# Patient Record
Sex: Female | Born: 1945 | Race: White | Hispanic: No | Smoking: Never smoker
Health system: Southern US, Community
[De-identification: ages and names within clinical notes are randomized; demographics above are authoritative.]

## PROBLEM LIST (undated history)

## (undated) DIAGNOSIS — Z789 Other specified health status: Secondary | ICD-10-CM

## (undated) DIAGNOSIS — T7840XA Allergy, unspecified, initial encounter: Secondary | ICD-10-CM

## (undated) DIAGNOSIS — E785 Hyperlipidemia, unspecified: Secondary | ICD-10-CM

## (undated) DIAGNOSIS — B182 Chronic viral hepatitis C: Secondary | ICD-10-CM

## (undated) DIAGNOSIS — I77810 Thoracic aortic ectasia: Secondary | ICD-10-CM

## (undated) DIAGNOSIS — I2510 Atherosclerotic heart disease of native coronary artery without angina pectoris: Secondary | ICD-10-CM

## (undated) DIAGNOSIS — R0600 Dyspnea, unspecified: Secondary | ICD-10-CM

## (undated) DIAGNOSIS — I10 Essential (primary) hypertension: Secondary | ICD-10-CM

## (undated) DIAGNOSIS — R0609 Other forms of dyspnea: Secondary | ICD-10-CM

## (undated) DIAGNOSIS — F32A Depression, unspecified: Secondary | ICD-10-CM

## (undated) HISTORY — DX: Allergy, unspecified, initial encounter: T78.40XA

## (undated) HISTORY — DX: Atherosclerotic heart disease of native coronary artery without angina pectoris: I25.10

## (undated) HISTORY — DX: Depression, unspecified: F32.A

## (undated) HISTORY — PX: ABDOMINAL HYSTERECTOMY: SHX81

## (undated) HISTORY — PX: BACK SURGERY: SHX140

## (undated) HISTORY — DX: Hyperlipidemia, unspecified: E78.5

## (undated) HISTORY — DX: Unspecified cirrhosis of liver: B18.2

## (undated) HISTORY — DX: Other forms of dyspnea: R06.09

## (undated) HISTORY — DX: Thoracic aortic ectasia: I77.810

## (undated) HISTORY — PX: CHOLECYSTECTOMY: SHX55

## (undated) HISTORY — DX: Essential (primary) hypertension: I10

## (undated) HISTORY — DX: Dyspnea, unspecified: R06.00

## (undated) HISTORY — DX: Other specified health status: Z78.9

---

## 2008-06-23 HISTORY — PX: LUMBAR DISC SURGERY: SHX700

## 2014-01-18 DIAGNOSIS — I10 Essential (primary) hypertension: Secondary | ICD-10-CM | POA: Insufficient documentation

## 2015-01-18 DIAGNOSIS — E782 Mixed hyperlipidemia: Secondary | ICD-10-CM | POA: Insufficient documentation

## 2015-01-18 DIAGNOSIS — K7460 Unspecified cirrhosis of liver: Secondary | ICD-10-CM | POA: Insufficient documentation

## 2015-01-18 DIAGNOSIS — B182 Chronic viral hepatitis C: Secondary | ICD-10-CM | POA: Insufficient documentation

## 2015-06-19 DIAGNOSIS — I77810 Thoracic aortic ectasia: Secondary | ICD-10-CM | POA: Insufficient documentation

## 2016-01-07 LAB — HM COLONOSCOPY

## 2016-07-29 DIAGNOSIS — F331 Major depressive disorder, recurrent, moderate: Secondary | ICD-10-CM | POA: Insufficient documentation

## 2017-04-06 DIAGNOSIS — I2584 Coronary atherosclerosis due to calcified coronary lesion: Secondary | ICD-10-CM | POA: Insufficient documentation

## 2017-04-06 DIAGNOSIS — I2510 Atherosclerotic heart disease of native coronary artery without angina pectoris: Secondary | ICD-10-CM | POA: Insufficient documentation

## 2018-03-30 LAB — HM DEXA SCAN

## 2018-04-01 LAB — HM MAMMOGRAPHY

## 2019-02-02 LAB — VITAMIN B12: Vitamin B-12: 501

## 2019-11-22 DIAGNOSIS — T466X5A Adverse effect of antihyperlipidemic and antiarteriosclerotic drugs, initial encounter: Secondary | ICD-10-CM | POA: Insufficient documentation

## 2020-09-05 LAB — CBC AND DIFFERENTIAL
HCT: 46 (ref 36–46)
Hemoglobin: 14.7 (ref 12.0–16.0)
Platelets: 223 (ref 150–399)
WBC: 7.7

## 2020-09-05 LAB — BASIC METABOLIC PANEL
BUN: 23 — AB (ref 4–21)
CO2: 26 — AB (ref 13–22)
Chloride: 103 (ref 99–108)
Creatinine: 0.8 (ref 0.5–1.1)
Glucose: 88
Potassium: 4.2 (ref 3.4–5.3)
Sodium: 140 (ref 137–147)

## 2020-09-05 LAB — LIPID PANEL
Cholesterol: 157 (ref 0–200)
HDL: 71 — AB (ref 35–70)
LDL Cholesterol: 50
Triglycerides: 180 — AB (ref 40–160)

## 2020-09-05 LAB — HEPATIC FUNCTION PANEL
ALT: 21 (ref 7–35)
AST: 21 (ref 13–35)
Alkaline Phosphatase: 67 (ref 25–125)
Bilirubin, Total: 0.5

## 2020-09-05 LAB — COMPREHENSIVE METABOLIC PANEL: Calcium: 10.1 (ref 8.7–10.7)

## 2020-09-05 LAB — TSH: TSH: 2.42 (ref 0.41–5.90)

## 2020-12-07 ENCOUNTER — Ambulatory Visit: Payer: Self-pay | Admitting: Internal Medicine

## 2020-12-10 ENCOUNTER — Other Ambulatory Visit: Payer: Self-pay | Admitting: Internal Medicine

## 2020-12-10 ENCOUNTER — Encounter: Payer: Self-pay | Admitting: Internal Medicine

## 2020-12-10 DIAGNOSIS — I700 Atherosclerosis of aorta: Secondary | ICD-10-CM | POA: Insufficient documentation

## 2020-12-11 ENCOUNTER — Ambulatory Visit: Payer: Medicare PPO | Admitting: Internal Medicine

## 2020-12-11 ENCOUNTER — Encounter: Payer: Self-pay | Admitting: Internal Medicine

## 2020-12-11 ENCOUNTER — Other Ambulatory Visit: Payer: Self-pay

## 2020-12-11 VITALS — BP 118/64 | HR 90 | Ht 66.0 in | Wt 254.0 lb

## 2020-12-11 DIAGNOSIS — F331 Major depressive disorder, recurrent, moderate: Secondary | ICD-10-CM

## 2020-12-11 DIAGNOSIS — I10 Essential (primary) hypertension: Secondary | ICD-10-CM | POA: Diagnosis not present

## 2020-12-11 DIAGNOSIS — I77810 Thoracic aortic ectasia: Secondary | ICD-10-CM

## 2020-12-11 DIAGNOSIS — M810 Age-related osteoporosis without current pathological fracture: Secondary | ICD-10-CM

## 2020-12-11 DIAGNOSIS — E039 Hypothyroidism, unspecified: Secondary | ICD-10-CM

## 2020-12-11 DIAGNOSIS — K7469 Other cirrhosis of liver: Secondary | ICD-10-CM | POA: Diagnosis not present

## 2020-12-11 DIAGNOSIS — K219 Gastro-esophageal reflux disease without esophagitis: Secondary | ICD-10-CM

## 2020-12-11 DIAGNOSIS — Z6841 Body Mass Index (BMI) 40.0 and over, adult: Secondary | ICD-10-CM

## 2020-12-11 DIAGNOSIS — E559 Vitamin D deficiency, unspecified: Secondary | ICD-10-CM

## 2020-12-11 MED ORDER — OMEPRAZOLE 40 MG PO CPDR: 40.0000 mg | DELAYED_RELEASE_CAPSULE | Freq: Every day | ORAL | 1 refills | Status: DC

## 2020-12-11 NOTE — Progress Notes (Signed)
Date:  12/11/2020   Name:  Rachel Dalton   DOB:  [birth year]   MRN:  [id number]   Chief Complaint: Establish Care (Patient came in today with her daughter in law Desiree who she calls her "life line.")  Hypertension This is a chronic problem. The problem is controlled. Pertinent negatives include no chest pain, headaches, palpitations or shortness of breath. Past treatments include angiotensin blockers and calcium channel blockers. Identifiable causes of hypertension include a thyroid problem.  Depression        This is a chronic problem.The problem is unchanged.  Associated symptoms include no fatigue, no headaches and no suicidal ideas.  Past treatments include SSRIs - Selective serotonin reuptake inhibitors and other medications.  Compliance with treatment is good.  Past medical history includes thyroid problem.   Hyperlipidemia This is a chronic problem. The problem is uncontrolled. Pertinent negatives include no chest pain or shortness of breath. Current antihyperlipidemic treatment includes ezetimibe (and repatha). The current treatment provides significant improvement of lipids.  Thyroid Problem Presents for follow-up visit. Symptoms include anxiety. Patient reports no fatigue or palpitations. The symptoms have been stable. Her past medical history is significant for hyperlipidemia.  Gastroesophageal Reflux She complains of heartburn. She reports no abdominal pain, no chest pain, no choking, no coughing, no dysphagia, no sore throat or no wheezing. The problem occurs occasionally. Pertinent negatives include no fatigue. She has tried a PPI for the symptoms. The treatment provided significant relief.   Lab Results  Component Value Date   CREATININE 0.8 09/05/2020   BUN 23 (A) 09/05/2020   NA 140 09/05/2020   K 4.2 09/05/2020   CL 103 09/05/2020   CO2 26 (A) 09/05/2020   Lab Results  Component Value Date   CHOL 157 09/05/2020   HDL 71 (A) 09/05/2020   LDLCALC 50 09/05/2020   TRIG  180 (A) 09/05/2020   Lab Results  Component Value Date   TSH 2.42 09/05/2020   No results found for: HGBA1C Lab Results  Component Value Date   WBC 7.7 09/05/2020   HGB 14.7 09/05/2020   HCT 46 09/05/2020   PLT 223 09/05/2020   Lab Results  Component Value Date   ALT 21 09/05/2020   AST 21 09/05/2020   ALKPHOS 67 09/05/2020     Review of Systems  Constitutional:  Positive for unexpected weight change. Negative for chills, fatigue and fever.  HENT:  Negative for sore throat.   Respiratory:  Negative for cough, choking, shortness of breath and wheezing.   Cardiovascular:  Negative for chest pain, palpitations and leg swelling.  Gastrointestinal:  Positive for heartburn. Negative for abdominal pain and dysphagia.  Genitourinary:  Negative for dysuria.  Musculoskeletal:  Negative for arthralgias and gait problem.  Skin:  Negative for color change and rash.  Neurological:  Negative for dizziness, light-headedness and headaches.  Psychiatric/Behavioral:  Positive for depression and dysphoric mood. Negative for sleep disturbance and suicidal ideas. The patient is nervous/anxious.        Mild memory impairment   Patient Active Problem List   Diagnosis Date Noted   Other cirrhosis of liver (HCC) 12/11/2020   Vitamin D deficiency 12/11/2020   GERD without esophagitis 12/11/2020   Acquired hypothyroidism 12/11/2020   Age-related osteoporosis without current pathological fracture 12/11/2020   BMI 40.0-44.9, adult (HCC) 12/11/2020   Aortic atherosclerosis (HCC) 12/10/2020   Myalgia due to statin 11/22/2019   Coronary artery calcification 04/06/2017   Depression, major, recurrent, moderate (HCC) 07/29/2016  Ascending aorta dilatation (HCC) 06/19/2015   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   Essential hypertension 01/18/2014    Allergies  Allergen Reactions   Topiramate Other (See Comments)    Balance and memory issues   Atorvastatin     Other reaction(s): Muscle Pain    Lisinopril Cough   Pravastatin     Other reaction(s): Muscle Pain   Rosuvastatin     Other reaction(s): Muscle Pain   Candesartan Other (See Comments)    dizziness   Tape Rash    Past Surgical History:  Procedure Laterality Date   ABDOMINAL HYSTERECTOMY     BACK SURGERY     CHOLECYSTECTOMY      Social History   Tobacco Use   Smoking status: Never   Smokeless tobacco: Never  Vaping Use   Vaping Use: Never used  Substance Use Topics   Alcohol use: Yes    Comment: occasionally   Drug use: Never     Medication list has been reviewed and updated.  Current Meds  Medication Sig   acetaminophen (TYLENOL) 500 MG tablet Take by mouth.   amLODipine (NORVASC) 5 MG tablet Take by mouth.   ascorbic acid (VITAMIN C) 1000 MG tablet Take by mouth.   aspirin 81 MG EC tablet Take by mouth.   buPROPion (WELLBUTRIN XL) 150 MG 24 hr tablet TAKE 1 TABLET(150 MG) BY MOUTH EVERY DAY   busPIRone (BUSPAR) 7.5 MG tablet TAKE 1 TABLET(7.5 MG) BY MOUTH TWICE DAILY   Cholecalciferol 25 MCG (1000 UT) tablet Take by mouth.   Evolocumab (REPATHA SURECLICK) 140 MG/ML SOAJ INJECT 1 ML UNDER THE SKIN EVERY 14 DAYS   ezetimibe (ZETIA) 10 MG tablet TAKE 1 TABLET(10 MG) BY MOUTH EVERY DAY   fluticasone (FLONASE) 50 MCG/ACT nasal spray Place into both nostrils daily.   levothyroxine (SYNTHROID) 100 MCG tablet Take by mouth.   loratadine (CLARITIN) 10 MG tablet Take 10 mg by mouth daily.   losartan (COZAAR) 25 MG tablet Take 1 tablet by mouth daily.   Multiple Vitamin (MULTI-VITAMIN) tablet Take by mouth.   sertraline (ZOLOFT) 100 MG tablet Take by mouth.   [DISCONTINUED] omeprazole (PRILOSEC) 40 MG capsule Take by mouth.    PHQ 2/9 Scores 12/11/2020  PHQ - 2 Score 6  PHQ- 9 Score 19    GAD 7 : Generalized Anxiety Score 12/11/2020  Nervous, Anxious, on Edge 2  Control/stop worrying 1  Worry too much - different things 1  Trouble relaxing 2  Restless 1  Easily annoyed or irritable 1  Afraid -  awful might happen 0  Total GAD 7 Score 8    BP Readings from Last 3 Encounters:  12/11/20 118/64    Physical Exam Vitals and nursing note reviewed.  Constitutional:      General: She is not in acute distress.    Appearance: She is well-developed.  HENT:     Head: Normocephalic and atraumatic.  Neck:     Vascular: No carotid bruit.  Cardiovascular:     Rate and Rhythm: Normal rate and regular rhythm.     Pulses: Normal pulses.     Heart sounds: No murmur heard. Pulmonary:     Effort: Pulmonary effort is normal. No respiratory distress.     Breath sounds: No wheezing or rhonchi.  Musculoskeletal:     Cervical back: Normal range of motion.     Right lower leg: No edema.     Left lower leg: No edema.  Lymphadenopathy:  Cervical: No cervical adenopathy.  Skin:    General: Skin is warm and dry.     Capillary Refill: Capillary refill takes less than 2 seconds.     Findings: No rash.  Neurological:     General: No focal deficit present.     Mental Status: She is alert and oriented to person, place, and time.  Psychiatric:        Mood and Affect: Mood normal.        Behavior: Behavior normal.    Wt Readings from Last 3 Encounters:  12/11/20 254 lb (115.2 kg)    BP 118/64   Pulse 90   Ht 5\' 6"  (1.676 m)   Wt 254 lb (115.2 kg)   LMP  (LMP Unknown)   SpO2 95%   BMI 41.00 kg/m   Assessment and Plan: 1. Essential hypertension Clinically stable exam with well controlled BP. Tolerating medications without side effects at this time. Pt to continue current regimen and low sodium diet; benefits of regular exercise as able discussed.  2. Ascending aorta dilatation [facility]) Being followed by Cardiology  3. Depression, major, recurrent, moderate (HCC) Clinically stable on current regimen with good control of symptoms, No SI or HI. Will continue current therapy.  4. Other cirrhosis of liver (HCC) S/p treatment for Hep C Recommend follow up with Gastroenterology as  planned  5. Acquired hypothyroidism Supplemented Recent labs normal  6. GERD without esophagitis Symptoms well controlled on daily PPI No red flag signs such as weight loss, n/v, melena Will continue omeprazole. - omeprazole (PRILOSEC) 40 MG capsule; Take 1 capsule (40 mg total) by mouth daily.  Dispense: 90 capsule; Refill: 1  7. Vitamin D deficiency On supplements  8. Age-related osteoporosis without current pathological fracture Hx of DEXA On Vitamin D  9. BMI 40.0-44.9, adult [facility]) Discussed portion control, more regular exercise Explore option for Silver Sneakers   Partially dictated using [facility]. Any errors are unintentional.  Animal nutritionist, MD [facility]  12/11/2020

## 2021-02-05 ENCOUNTER — Telehealth: Payer: Self-pay | Admitting: Internal Medicine

## 2021-02-05 NOTE — Telephone Encounter (Signed)
Copied from CRM [id number]. Topic: Medicare AWV >> Feb 05, 2021 11:02 AM Claudette Laws R wrote: Reason for CRM: Left message for patient to call back and schedule Medicare Annual Wellness Visit (AWV) in office.   If unable to come into the office for AWV,  please offer to do virtually or by telephone.  No hx of AWV eligible for AWVI as of 11/22/2011  Please schedule at anytime with [facility] Advisor.      40 Minutes appointment   Any questions, please call me at [phone]

## 2021-03-02 ENCOUNTER — Telehealth: Payer: Self-pay

## 2021-03-02 NOTE — Telephone Encounter (Signed)
I attempted to reach out to patient to get AWV completed. Unable to reach patient on call and VM was full.

## 2021-03-15 ENCOUNTER — Other Ambulatory Visit: Payer: Self-pay

## 2021-03-15 ENCOUNTER — Ambulatory Visit: Payer: Medicare PPO | Admitting: Internal Medicine

## 2021-03-15 ENCOUNTER — Encounter: Payer: Self-pay | Admitting: Internal Medicine

## 2021-03-15 VITALS — BP 134/82 | HR 88 | Temp 97.8°F | Ht 66.0 in | Wt 260.0 lb

## 2021-03-15 DIAGNOSIS — F331 Major depressive disorder, recurrent, moderate: Secondary | ICD-10-CM

## 2021-03-15 DIAGNOSIS — M25561 Pain in right knee: Secondary | ICD-10-CM | POA: Diagnosis not present

## 2021-03-15 DIAGNOSIS — J3089 Other allergic rhinitis: Secondary | ICD-10-CM

## 2021-03-15 DIAGNOSIS — K7469 Other cirrhosis of liver: Secondary | ICD-10-CM

## 2021-03-15 DIAGNOSIS — R269 Unspecified abnormalities of gait and mobility: Secondary | ICD-10-CM | POA: Diagnosis not present

## 2021-03-15 DIAGNOSIS — I10 Essential (primary) hypertension: Secondary | ICD-10-CM | POA: Diagnosis not present

## 2021-03-15 MED ORDER — FLUTICASONE PROPIONATE 50 MCG/ACT NA SUSP: 2.0000 | Freq: Every day | NASAL | 1 refills | Status: DC

## 2021-03-15 NOTE — Progress Notes (Signed)
Date:  03/15/2021   Name:  Rachel Dalton   DOB:  September 11, 1945   MRN:  [id number]   Chief Complaint: Hypertension, Depression, and Dizziness (X2-3 weeks, When walking getting worse, off balance )  Hypertension This is a chronic problem. The problem is controlled. Pertinent negatives include no chest pain, headaches, palpitations or shortness of breath. Past treatments include calcium channel blockers and angiotensin blockers. There is no history of kidney disease, CAD/MI or CVA.  Depression        This is a chronic problem.The problem is unchanged.  Associated symptoms include fatigue.  Associated symptoms include no headaches.  Past treatments include SSRIs - Selective serotonin reuptake inhibitors (with buspar and bupropion).  Compliance with treatment is good. Dizziness This is a recurrent problem. The problem occurs 2 to 4 times per day. The problem has been unchanged. Associated symptoms include arthralgias (right knee pain) and fatigue. Pertinent negatives include no abdominal pain, chest pain, chills, coughing, fever, headaches, vertigo or weakness (but some balance issues). The symptoms are aggravated by standing. She has tried nothing for the symptoms.  Knee Pain  The injury mechanism was a fall. The pain is present in the right knee. The quality of the pain is described as shooting. The pain is moderate. The pain has been Fluctuating since onset. The symptoms are aggravated by movement. She has tried nothing for the symptoms.    ECHO 01/2021: INTERPRETATION  MODERATE ASCENDING AORTA DILATION (4.5CM)  NORMAL LV SYSTOLIC FUNCTION WITH MODERATE CONCENTRIC LVH  ENLARGED LEFT ATRIUM (4.1CM, 23CM2)  AORTIC VALVE IS NOT WELL-VISUALIZED, BUT IS FULLY MOBILE WITHOUT SIGNIFICANT STENOSIS  NORMAL RV SIZE AND FUNCTION  NORMAL RIGHT ATRIAL PRESSURE  COMPARED WITH ECHO FROM 08/2017, LVH HAS PROGRESSED.  _________________________________________________________________________________________ Lab  Results  Component Value Date   CREATININE 0.8 09/05/2020   BUN 23 (A) 09/05/2020   NA 140 09/05/2020   K 4.2 09/05/2020   CL 103 09/05/2020   CO2 26 (A) 09/05/2020   Lab Results  Component Value Date   CHOL 157 09/05/2020   HDL 71 (A) 09/05/2020   LDLCALC 50 09/05/2020   TRIG 180 (A) 09/05/2020   Lab Results  Component Value Date   TSH 2.42 09/05/2020   No results found for: HGBA1C Lab Results  Component Value Date   WBC 7.7 09/05/2020   HGB 14.7 09/05/2020   HCT 46 09/05/2020   PLT 223 09/05/2020   Lab Results  Component Value Date   ALT 21 09/05/2020   AST 21 09/05/2020   ALKPHOS 67 09/05/2020     Review of Systems  Constitutional:  Positive for fatigue. Negative for chills, fever and unexpected weight change.  Respiratory:  Negative for cough, chest tightness, shortness of breath and wheezing.   Cardiovascular:  Negative for chest pain, palpitations and leg swelling.  Gastrointestinal:  Negative for abdominal pain, constipation and diarrhea.  Musculoskeletal:  Positive for arthralgias (right knee pain) and gait problem (imbalance).  Neurological:  Positive for dizziness and light-headedness. Negative for vertigo, weakness (but some balance issues) and headaches.  Psychiatric/Behavioral:  Positive for depression and dysphoric mood. Negative for sleep disturbance. The patient is nervous/anxious.    Patient Active Problem List   Diagnosis Date Noted   Other cirrhosis of liver (HCC) 12/11/2020   Vitamin D deficiency 12/11/2020   GERD without esophagitis 12/11/2020   Acquired hypothyroidism 12/11/2020   Age-related osteoporosis without current pathological fracture 12/11/2020   BMI 40.0-44.9, adult (HCC) 12/11/2020   Aortic  atherosclerosis (HCC) 12/10/2020   Myalgia due to statin 11/22/2019   Coronary artery calcification 04/06/2017   Depression, major, recurrent, moderate (HCC) 07/29/2016   Ascending aorta dilatation (HCC) 06/19/2015   Combined hyperlipidemia  01/18/2015   Essential hypertension 01/18/2014    Allergies  Allergen Reactions   Topiramate Other (See Comments)    Balance and memory issues   Atorvastatin     Other reaction(s): Muscle Pain   Lisinopril Cough   Pravastatin     Other reaction(s): Muscle Pain   Rosuvastatin     Other reaction(s): Muscle Pain   Candesartan Other (See Comments)    dizziness   Tape Rash    Past Surgical History:  Procedure Laterality Date   ABDOMINAL HYSTERECTOMY     BACK SURGERY     CHOLECYSTECTOMY      Social History   Tobacco Use   Smoking status: Never   Smokeless tobacco: Never  Vaping Use   Vaping Use: Never used  Substance Use Topics   Alcohol use: Yes    Comment: occasionally   Drug use: Never     Medication list has been reviewed and updated.  Current Meds  Medication Sig   acetaminophen (TYLENOL) 500 MG tablet Take by mouth.   amLODipine (NORVASC) 5 MG tablet Take by mouth.   aspirin 81 MG EC tablet Take by mouth.   buPROPion (WELLBUTRIN XL) 150 MG 24 hr tablet TAKE 1 TABLET(150 MG) BY MOUTH EVERY DAY   busPIRone (BUSPAR) 7.5 MG tablet daily.   Cholecalciferol 25 MCG (1000 UT) tablet Take by mouth.   Evolocumab (REPATHA SURECLICK) 140 MG/ML SOAJ INJECT 1 ML UNDER THE SKIN EVERY 14 DAYS   ezetimibe (ZETIA) 10 MG tablet TAKE 1 TABLET(10 MG) BY MOUTH EVERY DAY   fluticasone (FLONASE) 50 MCG/ACT nasal spray Place into both nostrils daily.   levothyroxine (SYNTHROID) 100 MCG tablet Take by mouth.   losartan (COZAAR) 25 MG tablet Take 1 tablet by mouth daily.   Multiple Vitamin (MULTI-VITAMIN) tablet Take by mouth.   multivitamin-lutein (OCUVITE-LUTEIN) CAPS capsule Take 1 capsule by mouth daily.   Omega-3 Fatty Acids (FISH OIL) 1200 MG CAPS Take by mouth daily.   omeprazole (PRILOSEC) 40 MG capsule Take 1 capsule (40 mg total) by mouth daily.   sertraline (ZOLOFT) 100 MG tablet Take by mouth.    PHQ 2/9 Scores 03/15/2021 12/11/2020  PHQ - 2 Score 6 6  PHQ- 9 Score 15  19    GAD 7 : Generalized Anxiety Score 03/15/2021 12/11/2020  Nervous, Anxious, on Edge 0 2  Control/stop worrying 0 1  Worry too much - different things 0 1  Trouble relaxing 2 2  Restless 1 1  Easily annoyed or irritable 1 1  Afraid - awful might happen 0 0  Total GAD 7 Score 4 8    BP Readings from Last 3 Encounters:  03/15/21 134/82  12/11/20 118/64    Physical Exam Vitals and nursing note reviewed.  Constitutional:      General: She is not in acute distress.    Appearance: Normal appearance. She is well-developed. She is obese.  HENT:     Head: Normocephalic and atraumatic.  Cardiovascular:     Rate and Rhythm: Normal rate and regular rhythm.     Pulses: Normal pulses.     Heart sounds: Murmur heard.  Systolic murmur is present with a grade of 1/6.  Pulmonary:     Effort: Pulmonary effort is normal. No respiratory distress.  Breath sounds: Normal breath sounds. No wheezing or rhonchi.  Musculoskeletal:     Cervical back: Normal range of motion.     Right knee: Decreased range of motion. Tenderness present over the medial joint line.     Right lower leg: No edema.     Left lower leg: No edema.  Lymphadenopathy:     Cervical: No cervical adenopathy.  Skin:    General: Skin is warm and dry.     Findings: No rash.  Neurological:     Mental Status: She is alert and oriented to person, place, and time.  Psychiatric:        Mood and Affect: Mood normal.        Behavior: Behavior normal.    Wt Readings from Last 3 Encounters:  03/15/21 260 lb (117.9 kg)  12/11/20 254 lb (115.2 kg)    BP 134/82   Pulse 88   Temp 97.8 F (36.6 C) (Oral)   Ht 5\' 6"  (1.676 m)   Wt 260 lb (117.9 kg)   LMP  (LMP Unknown)   BMI 41.97 kg/m   Assessment and Plan: 1. Essential hypertension Clinically stable exam with well controlled BP. Tolerating medications without side effects at this time. Pt to continue current regimen and low sodium diet; benefits of regular exercise  as able discussed.  2. Acute pain of right knee S/p fall at home. Suspect possible meniscal injury.  May need to consult orthopedics.  3. Depression, major, recurrent, moderate (HCC) Fair control of sx.  No SI/HI. Continue Zoloft 100 mg - consider increased dose.  Continue Buspar and Bupropion  4. Gait disturbance Decreased balance; deconditioning; possible posterior circulation/lightheadedness contributing. CTA being done this weekend and, if normal, will consider PT referral  5. Environmental and seasonal allergies - fluticasone (FLONASE) 50 MCG/ACT nasal spray; Place 2 sprays into both nostrils daily.  Dispense: 54 mL; Refill: 1  6. Other cirrhosis of liver (HCC) Pt cancelled her GI follow up due to the distance.  She agrees to see GI here in [place] so will refer. Recent LFTs were stable. Last imaging was 01/2019 MRI liver: Impression:  1. 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 LI-RADS  2. No evidence of HCC. No morphologic or capsular changes to suggest known  cirrhosis. No sequelae of portal venous hypertension.  - Ambulatory referral to Gastroenterology   Partially dictated using Dragon software. Any errors are unintentional.  02/2019, MD [facility]  03/15/2021

## 2021-03-18 ENCOUNTER — Encounter: Payer: Self-pay | Admitting: *Deleted

## 2021-04-29 ENCOUNTER — Other Ambulatory Visit: Payer: Self-pay

## 2021-04-29 ENCOUNTER — Ambulatory Visit: Payer: Medicare PPO

## 2021-05-03 ENCOUNTER — Ambulatory Visit: Payer: Medicare PPO | Admitting: Internal Medicine

## 2021-05-28 ENCOUNTER — Other Ambulatory Visit: Payer: Self-pay

## 2021-05-28 ENCOUNTER — Ambulatory Visit: Payer: Medicare PPO | Admitting: Internal Medicine

## 2021-05-28 ENCOUNTER — Encounter: Payer: Self-pay | Admitting: Internal Medicine

## 2021-05-28 VITALS — BP 134/88 | HR 49 | Ht 66.0 in | Wt 264.0 lb

## 2021-05-28 DIAGNOSIS — I10 Essential (primary) hypertension: Secondary | ICD-10-CM

## 2021-05-28 DIAGNOSIS — E041 Nontoxic single thyroid nodule: Secondary | ICD-10-CM | POA: Insufficient documentation

## 2021-05-28 DIAGNOSIS — F331 Major depressive disorder, recurrent, moderate: Secondary | ICD-10-CM | POA: Diagnosis not present

## 2021-05-28 NOTE — Progress Notes (Signed)
Date:  05/28/2021   Name:  Rachel Dalton   DOB:  29-Aug-1945   MRN:  [id number]   Chief Complaint: Hypertension and Depression  Hypertension This is a chronic problem. The problem is controlled. Pertinent negatives include no chest pain, headaches, palpitations or shortness of breath. Past treatments include calcium channel blockers and angiotensin blockers. There is no history of kidney disease, CAD/MI or CVA. Identifiable causes of hypertension include a thyroid problem.  Depression        This is a chronic problem.The problem is unchanged.  Associated symptoms include no fatigue and no headaches.  Past treatments include SSRIs - Selective serotonin reuptake inhibitors and other medications.  Compliance with treatment is good.  Past medical history includes thyroid problem.   Thyroid Problem Presents for follow-up visit. Patient reports no constipation, diarrhea, fatigue or palpitations. (4 mm left thyroid nodule on Ct at [facility] )   Lab Results  Component Value Date   NA 140 09/05/2020   K 4.2 09/05/2020   CO2 26 (A) 09/05/2020   BUN 23 (A) 09/05/2020   CREATININE 0.8 09/05/2020   CALCIUM 10.1 09/05/2020   Lab Results  Component Value Date   CHOL 157 09/05/2020   HDL 71 (A) 09/05/2020   LDLCALC 50 09/05/2020   TRIG 180 (A) 09/05/2020   Lab Results  Component Value Date   TSH 2.42 09/05/2020   No results found for: HGBA1C Lab Results  Component Value Date   WBC 7.7 09/05/2020   HGB 14.7 09/05/2020   HCT 46 09/05/2020   PLT 223 09/05/2020   Lab Results  Component Value Date   ALT 21 09/05/2020   AST 21 09/05/2020   ALKPHOS 67 09/05/2020   No results found for: 25OHVITD2, 25OHVITD3, VD25OH   Review of Systems  Constitutional:  Negative for fatigue and unexpected weight change.  HENT:  Negative for nosebleeds.   Eyes:  Negative for visual disturbance.  Respiratory:  Negative for cough, chest tightness, shortness of breath and wheezing.   Cardiovascular:  Negative  for chest pain, palpitations and leg swelling.  Gastrointestinal:  Negative for abdominal pain, constipation and diarrhea.  Neurological:  Negative for dizziness, weakness, light-headedness and headaches.  Psychiatric/Behavioral:  Positive for depression.    Patient Active Problem List   Diagnosis Date Noted   Other cirrhosis of liver (HCC) 12/11/2020   Vitamin D deficiency 12/11/2020   GERD without esophagitis 12/11/2020   Acquired hypothyroidism 12/11/2020   Age-related osteoporosis without current pathological fracture 12/11/2020   BMI 40.0-44.9, adult (HCC) 12/11/2020   Aortic atherosclerosis (HCC) 12/10/2020   Myalgia due to statin 11/22/2019   Coronary artery calcification 04/06/2017   Depression, major, recurrent, moderate (HCC) 07/29/2016   Ascending aorta dilatation (HCC) 06/19/2015   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   Essential hypertension 01/18/2014    Allergies  Allergen Reactions   Topiramate Other (See Comments)    Balance and memory issues   Atorvastatin     Other reaction(s): Muscle Pain   Lisinopril Cough   Pravastatin     Other reaction(s): Muscle Pain   Rosuvastatin     Other reaction(s): Muscle Pain   Candesartan Other (See Comments)    dizziness   Tape Rash    Past Surgical History:  Procedure Laterality Date   ABDOMINAL HYSTERECTOMY     BACK SURGERY     CHOLECYSTECTOMY      Social History   Tobacco Use   Smoking status: Never   Smokeless tobacco: Never  Vaping Use   Vaping Use: Never used  Substance Use Topics   Alcohol use: Yes    Comment: occasionally   Drug use: Never     Medication list has been reviewed and updated.  Current Meds  Medication Sig   acetaminophen (TYLENOL) 500 MG tablet Take by mouth.   amLODipine (NORVASC) 5 MG tablet Take by mouth.   ascorbic acid (VITAMIN C) 1000 MG tablet Take by mouth.   aspirin 81 MG EC tablet Take by mouth.   buPROPion (WELLBUTRIN XL) 150 MG 24 hr tablet TAKE 1 TABLET(150 MG) BY  MOUTH EVERY DAY   busPIRone (BUSPAR) 7.5 MG tablet Take 1 tablet by mouth in the morning and at bedtime.   Evolocumab (REPATHA SURECLICK) XX123456 MG/ML SOAJ INJECT 1 ML UNDER THE SKIN EVERY 14 DAYS   ezetimibe (ZETIA) 10 MG tablet TAKE 1 TABLET(10 MG) BY MOUTH EVERY DAY   fluticasone (FLONASE) 50 MCG/ACT nasal spray Place 2 sprays into both nostrils daily.   levothyroxine (SYNTHROID) 100 MCG tablet Take by mouth.   losartan (COZAAR) 25 MG tablet Take 1 tablet by mouth daily.   Multiple Vitamin (MULTI-VITAMIN) tablet Take by mouth.   multivitamin-lutein (OCUVITE-LUTEIN) CAPS capsule Take 1 capsule by mouth daily.   Omega-3 Fatty Acids (FISH OIL) 1200 MG CAPS Take by mouth daily.   omeprazole (PRILOSEC) 40 MG capsule Take 1 capsule (40 mg total) by mouth daily.   sertraline (ZOLOFT) 100 MG tablet Take 1 tablet by mouth daily.    PHQ 2/9 Scores 05/28/2021 03/15/2021 12/11/2020  PHQ - 2 Score 4 6 6   PHQ- 9 Score 17 15 19     GAD 7 : Generalized Anxiety Score 05/28/2021 03/15/2021 12/11/2020  Nervous, Anxious, on Edge 1 0 2  Control/stop worrying 1 0 1  Worry too much - different things 1 0 1  Trouble relaxing 1 2 2   Restless 1 1 1   Easily annoyed or irritable 1 1 1   Afraid - awful might happen 0 0 0  Total GAD 7 Score 6 4 8   Anxiety Difficulty Not difficult at all - -    BP Readings from Last 3 Encounters:  05/28/21 134/88  03/15/21 134/82  12/11/20 118/64    Physical Exam Vitals and nursing note reviewed.  Constitutional:      General: She is not in acute distress.    Appearance: Normal appearance. She is well-developed.  HENT:     Head: Normocephalic and atraumatic.  Cardiovascular:     Rate and Rhythm: Normal rate and regular rhythm.     Pulses: Normal pulses.  Pulmonary:     Effort: Pulmonary effort is normal. No respiratory distress.     Breath sounds: No wheezing or rhonchi.  Musculoskeletal:     Cervical back: Normal range of motion.     Right lower leg: No edema.      Left lower leg: No edema.  Lymphadenopathy:     Cervical: No cervical adenopathy.  Skin:    General: Skin is warm and dry.     Findings: No rash.  Neurological:     General: No focal deficit present.     Mental Status: She is alert and oriented to person, place, and time.  Psychiatric:        Mood and Affect: Mood normal.        Behavior: Behavior normal.    Wt Readings from Last 3 Encounters:  05/28/21 264 lb (119.7 kg)  03/15/21 260 lb (117.9 kg)  12/11/20 254 lb (115.2 kg)    BP 134/88   Pulse (!) 49   Ht 5\' 6"  (1.676 m)   Wt 264 lb (119.7 kg)   LMP  (LMP Unknown)   SpO2 97%   BMI 42.61 kg/m   Assessment and Plan: 1. Essential hypertension BP fairly well controlled but having some issues taking meds every day Change all doses to PM for better compliance.  She has gained 10 lbs - encourage diet changes and more regular exercise  2. Depression, major, recurrent, moderate (HCC) Clinically stable on current regimen with fair control of symptoms, No SI or HI. Will continue current therapy - adjust meds to evening except for Buspar.  3. Thyroid nodule Recent TSH was normal.  Will get [place] to evaluate nodule. - US THYROID   Partially dictated using Editor, commissioning. Any errors are unintentional.  Halina Maidens, MD [facility]  05/28/2021

## 2021-05-28 NOTE — Patient Instructions (Addendum)
Take Levothyroxine and one dose of Buspar 7.5 mg in the morning  You can take all the other meds in the evening.  [facility] number:  [phone]

## 2021-06-03 ENCOUNTER — Ambulatory Visit
Admission: RE | Admit: 2021-06-03 | Discharge: 2021-06-03 | Disposition: A | Discharge status: Home / Self Care | Payer: Medicare PPO | Source: Ambulatory Visit | Attending: Internal Medicine | Admitting: Internal Medicine

## 2021-06-03 ENCOUNTER — Other Ambulatory Visit: Payer: Self-pay

## 2021-06-03 DIAGNOSIS — E041 Nontoxic single thyroid nodule: Secondary | ICD-10-CM | POA: Diagnosis present

## 2021-06-04 ENCOUNTER — Other Ambulatory Visit: Payer: Self-pay

## 2021-06-04 DIAGNOSIS — E041 Nontoxic single thyroid nodule: Secondary | ICD-10-CM

## 2021-06-25 ENCOUNTER — Other Ambulatory Visit: Payer: Self-pay | Admitting: Internal Medicine

## 2021-06-25 NOTE — Telephone Encounter (Signed)
Medication Refill - Medication: busPIRone (BUSPAR) 7.5 MG tablet  Has the patient contacted their pharmacy? Yes.    (Agent: If yes, when and what did the pharmacy advise?) Since this is a new medication contact PCP directly  Preferred Pharmacy (with phone number or street name):   [id number] - [place], [place] - [address] Phone:  [phone]  Fax:  [phone]     Has the patient been seen for an appointment in the last year OR does the patient have an upcoming appointment? Yes.    Agent: Please be advised that RX refills may take up to 3 business days. We ask that you follow-up with your pharmacy.

## 2021-06-26 NOTE — Telephone Encounter (Signed)
Requested medication (s) are due for refill today Yes  Requested medication (s) are on the active medication list Yes  Future visit scheduled Yes for 09/26/21.    Note to clinic-last prescribed 05/06/21 by historical provider per chart. Routing to physician for review.    Requested Prescriptions  Pending Prescriptions Disp Refills   busPIRone (BUSPAR) 7.5 MG tablet      Sig: Take 1 tablet (7.5 mg total) by mouth in the morning and at bedtime.     Psychiatry: Anxiolytics/Hypnotics - Non-controlled Passed - 06/25/2021  6:55 PM      Passed - Valid encounter within last 6 months    Recent Outpatient Visits           4 weeks ago Essential hypertension   [facility] Reubin Milan, MD   3 months ago Essential hypertension   [facility] Reubin Milan, MD   6 months ago Essential hypertension   [facility] Reubin Milan, MD       Future Appointments             In 3 months Judithann Graves Nyoka Cowden, MD [facility], [facility]

## 2021-06-27 MED ORDER — BUSPIRONE HCL 7.5 MG PO TABS: 7.5000 mg | ORAL_TABLET | Freq: Two times a day (BID) | ORAL | 3 refills | Status: DC

## 2021-06-27 NOTE — Telephone Encounter (Signed)
Please review. Doesn't look like this medication was prescribed by you.  KP

## 2021-07-12 ENCOUNTER — Other Ambulatory Visit: Payer: Self-pay | Admitting: Otolaryngology

## 2021-07-12 DIAGNOSIS — E041 Nontoxic single thyroid nodule: Secondary | ICD-10-CM

## 2021-07-16 ENCOUNTER — Ambulatory Visit
Admission: RE | Admit: 2021-07-16 | Discharge: 2021-07-16 | Disposition: A | Discharge status: Home / Self Care | Payer: Medicare PPO | Source: Ambulatory Visit | Attending: Otolaryngology | Admitting: Otolaryngology

## 2021-07-16 ENCOUNTER — Other Ambulatory Visit: Payer: Self-pay

## 2021-07-16 ENCOUNTER — Other Ambulatory Visit
Admission: RE | Admit: 2021-07-16 | Discharge: 2021-07-16 | Disposition: A | Discharge status: Home / Self Care | Payer: Medicare PPO | Source: Home / Self Care | Attending: Otolaryngology | Admitting: Otolaryngology

## 2021-07-16 DIAGNOSIS — E041 Nontoxic single thyroid nodule: Secondary | ICD-10-CM | POA: Diagnosis present

## 2021-07-16 LAB — T4, FREE: Free T4: 0.98 ng/dL (ref 0.61–1.12)

## 2021-07-16 LAB — TSH: TSH: 1.985 u[IU]/mL (ref 0.350–4.500)

## 2021-07-16 NOTE — Procedures (Signed)
Successful US guided FNA of right inferior thyroid nodule No complications. See PACS for full report.  Pattricia Boss D, PA-C 07/16/2021, 1:40 PM

## 2021-07-17 LAB — T3, FREE: T3, Free: 2.3 pg/mL (ref 2.0–4.4)

## 2021-08-06 ENCOUNTER — Other Ambulatory Visit (HOSPITAL_COMMUNITY): Payer: Self-pay | Admitting: Otolaryngology

## 2021-08-06 ENCOUNTER — Encounter: Payer: Self-pay | Admitting: Otolaryngology

## 2021-08-06 ENCOUNTER — Other Ambulatory Visit: Payer: Self-pay | Admitting: Otolaryngology

## 2021-08-06 DIAGNOSIS — E041 Nontoxic single thyroid nodule: Secondary | ICD-10-CM

## 2021-08-06 LAB — CYTOLOGY - NON PAP

## 2021-08-14 ENCOUNTER — Other Ambulatory Visit: Payer: Self-pay | Admitting: Internal Medicine

## 2021-08-14 NOTE — Telephone Encounter (Signed)
Medication Refill - Medication: buPROPion (WELLBUTRIN XL) 150 MG 24 hr tablet   Pt is almost out   Has the patient contacted their pharmacy? Yes.   (Agent: If no, request that the patient contact the pharmacy for the refill. If patient does not wish to contact the pharmacy document the reason why and proceed with request.) (Agent: If yes, when and what did the pharmacy advise?)  Preferred Pharmacy (with phone number or street name):  [id number] - [place], [place] - [address]  [address] [place]  [address]  Phone: [phone] Fax: [phone]   Has the patient been seen for an appointment in the last year OR does the patient have an upcoming appointment? Yes.    Agent: Please be advised that RX refills may take up to 3 business days. We ask that you follow-up with your pharmacy.

## 2021-08-14 NOTE — Telephone Encounter (Signed)
Requested medication (s) are due for refill today:unclear  Requested medication (s) are on the active medication list: yes  Last refill:  04/27/2020  Future visit scheduled: 09/26/21  Notes to clinic:  Historical Provider, please assess.  Requested Prescriptions  Pending Prescriptions Disp Refills   buPROPion (WELLBUTRIN XL) 150 MG 24 hr tablet       Psychiatry: Antidepressants - bupropion Passed - 08/14/2021 12:36 PM      Passed - Cr in normal range and within 360 days    Creatinine  Date Value Ref Range Status  09/05/2020 0.8 0.5 - 1.1 Final          Passed - AST in normal range and within 360 days    AST  Date Value Ref Range Status  09/05/2020 21 13 - 35 Final          Passed - ALT in normal range and within 360 days    ALT  Date Value Ref Range Status  09/05/2020 21 7 - 35 Final          Passed - Completed PHQ-2 or PHQ-9 in the last 360 days      Passed - Last BP in normal range    BP Readings from Last 1 Encounters:  07/16/21 136/86          Passed - Valid encounter within last 6 months    Recent Outpatient Visits           2 months ago Essential hypertension   [facility] Reubin Milan, MD   5 months ago Essential hypertension   [facility] Reubin Milan, MD   8 months ago Essential hypertension   [facility] Reubin Milan, MD       Future Appointments             In 1 month Judithann Graves, Nyoka Cowden, MD [facility], [facility]

## 2021-09-19 ENCOUNTER — Telehealth: Payer: Self-pay

## 2021-09-19 NOTE — Telephone Encounter (Signed)
Called Rachel Dalton to let her know we have to cancel her appt on April 6th of next month, because Dr Karn Cassis husband is having a procedure done. Unable to reach Rachel Dalton or leave VM because it was FULL. ?Patient needs to reschedule her Physical. ?CRM created. ?

## 2021-09-23 NOTE — Telephone Encounter (Signed)
THIS NOTE WAS AN ERROR. PLEASE DISREGARD.  ?

## 2021-09-26 ENCOUNTER — Encounter: Payer: Medicare PPO | Admitting: Internal Medicine

## 2021-10-27 ENCOUNTER — Other Ambulatory Visit: Payer: Self-pay | Admitting: Internal Medicine

## 2021-10-28 NOTE — Telephone Encounter (Signed)
Requested Prescriptions  ?Pending Prescriptions Disp Refills  ?? busPIRone (BUSPAR) 7.5 MG tablet [Pharmacy Med Name: BUSPIRONE 7.5MG  TABLETS] 60 tablet 2  ?  Sig: TAKE 1 TABLET(7.5 MG) BY MOUTH IN THE MORNING AND AT BEDTIME  ?  ? Psychiatry: Anxiolytics/Hypnotics - Non-controlled Passed - 10/27/2021 11:17 AM  ?  ?  Passed - Valid encounter within last 12 months  ?  Recent Outpatient Visits   ?      ? 5 months ago Essential hypertension  ? [facility] Reubin Milan, MD  ? 7 months ago Essential hypertension  ? [facility] Reubin Milan, MD  ? 10 months ago Essential hypertension  ? [facility] Reubin Milan, MD  ?  ?  ?Future Appointments   ?        ? In 3 days Reubin Milan, MD [facility], [facility]  ?  ? ?  ?  ?  ? ?

## 2021-10-30 ENCOUNTER — Encounter: Payer: Medicare PPO | Admitting: Internal Medicine

## 2021-10-31 ENCOUNTER — Encounter: Payer: Self-pay | Admitting: Internal Medicine

## 2021-10-31 ENCOUNTER — Ambulatory Visit (INDEPENDENT_AMBULATORY_CARE_PROVIDER_SITE_OTHER): Payer: Medicare PPO | Admitting: Internal Medicine

## 2021-10-31 ENCOUNTER — Other Ambulatory Visit
Admission: RE | Admit: 2021-10-31 | Discharge: 2021-10-31 | Disposition: A | Discharge status: Home / Self Care | Payer: Medicare PPO | Attending: Internal Medicine | Admitting: Internal Medicine

## 2021-10-31 VITALS — BP 120/82 | HR 88 | Ht 66.0 in | Wt 260.0 lb

## 2021-10-31 DIAGNOSIS — M542 Cervicalgia: Secondary | ICD-10-CM

## 2021-10-31 DIAGNOSIS — M810 Age-related osteoporosis without current pathological fracture: Secondary | ICD-10-CM

## 2021-10-31 DIAGNOSIS — F331 Major depressive disorder, recurrent, moderate: Secondary | ICD-10-CM | POA: Diagnosis not present

## 2021-10-31 DIAGNOSIS — E890 Postprocedural hypothyroidism: Secondary | ICD-10-CM | POA: Diagnosis not present

## 2021-10-31 DIAGNOSIS — K219 Gastro-esophageal reflux disease without esophagitis: Secondary | ICD-10-CM | POA: Diagnosis not present

## 2021-10-31 DIAGNOSIS — I10 Essential (primary) hypertension: Secondary | ICD-10-CM | POA: Insufficient documentation

## 2021-10-31 DIAGNOSIS — K7469 Other cirrhosis of liver: Secondary | ICD-10-CM | POA: Diagnosis not present

## 2021-10-31 DIAGNOSIS — Z0000 Encounter for general adult medical examination without abnormal findings: Secondary | ICD-10-CM | POA: Diagnosis not present

## 2021-10-31 DIAGNOSIS — E559 Vitamin D deficiency, unspecified: Secondary | ICD-10-CM

## 2021-10-31 DIAGNOSIS — E039 Hypothyroidism, unspecified: Secondary | ICD-10-CM | POA: Diagnosis not present

## 2021-10-31 DIAGNOSIS — Z1231 Encounter for screening mammogram for malignant neoplasm of breast: Secondary | ICD-10-CM | POA: Diagnosis not present

## 2021-10-31 DIAGNOSIS — Z79899 Other long term (current) drug therapy: Secondary | ICD-10-CM | POA: Insufficient documentation

## 2021-10-31 DIAGNOSIS — I700 Atherosclerosis of aorta: Secondary | ICD-10-CM | POA: Diagnosis not present

## 2021-10-31 DIAGNOSIS — Z6841 Body Mass Index (BMI) 40.0 and over, adult: Secondary | ICD-10-CM | POA: Diagnosis not present

## 2021-10-31 DIAGNOSIS — G8929 Other chronic pain: Secondary | ICD-10-CM | POA: Diagnosis not present

## 2021-10-31 LAB — COMPREHENSIVE METABOLIC PANEL
ALT: 25 U/L (ref 0–44)
AST: 25 U/L (ref 15–41)
Albumin: 4.3 g/dL (ref 3.5–5.0)
Alkaline Phosphatase: 74 U/L (ref 38–126)
Anion gap: 9 (ref 5–15)
BUN: 19 mg/dL (ref 8–23)
CO2: 24 mmol/L (ref 22–32)
Calcium: 9.7 mg/dL (ref 8.9–10.3)
Chloride: 105 mmol/L (ref 98–111)
Creatinine, Ser: 0.86 mg/dL (ref 0.44–1.00)
GFR, Estimated: >60 mL/min (ref >60)
Glucose, Bld: 122 mg/dL — ABNORMAL HIGH (ref 70–99)
Potassium: 3.5 mmol/L (ref 3.5–5.1)
Sodium: 138 mmol/L (ref 135–145)
Total Bilirubin: 0.4 mg/dL (ref 0.3–1.2)
Total Protein: 7.3 g/dL (ref 6.5–8.1)

## 2021-10-31 LAB — CBC WITH DIFFERENTIAL/PLATELET
Abs Immature Granulocytes: 0.02 10*3/uL (ref 0.00–0.07)
Basophils Absolute: 0.1 10*3/uL (ref 0.0–0.1)
Basophils Relative: 1 %
Eosinophils Absolute: 0.1 10*3/uL (ref 0.0–0.5)
Eosinophils Relative: 1 %
HCT: 41.5 % (ref 36.0–46.0)
Hemoglobin: 14.4 g/dL (ref 12.0–15.0)
Immature Granulocytes: 0 %
Lymphocytes Relative: 19 %
Lymphs Abs: 1.1 10*3/uL (ref 0.7–4.0)
MCH: 30.6 pg (ref 26.0–34.0)
MCHC: 34.7 g/dL (ref 30.0–36.0)
MCV: 88.1 fL (ref 80.0–100.0)
Monocytes Absolute: 0.5 10*3/uL (ref 0.1–1.0)
Monocytes Relative: 8 %
Neutro Abs: 4.1 10*3/uL (ref 1.7–7.7)
Neutrophils Relative %: 71 %
Platelets: 232 10*3/uL (ref 150–400)
RBC: 4.71 MIL/uL (ref 3.87–5.11)
RDW: 13.3 % (ref 11.5–15.5)
WBC: 5.8 10*3/uL (ref 4.0–10.5)
nRBC: 0 % (ref 0.0–0.2)

## 2021-10-31 LAB — POCT URINALYSIS DIPSTICK
Bilirubin, UA: NEGATIVE
Blood, UA: NEGATIVE
Glucose, UA: NEGATIVE
Ketones, UA: NEGATIVE
Leukocytes, UA: NEGATIVE
Nitrite, UA: NEGATIVE
Protein, UA: POSITIVE — AB
Spec Grav, UA: 1.02 (ref 1.010–1.025)
Urobilinogen, UA: 0.2 E.U./dL
pH, UA: 6.5 (ref 5.0–8.0)

## 2021-10-31 LAB — LIPID PANEL
Cholesterol: 186 mg/dL (ref 0–200)
HDL: 62 mg/dL (ref >40)
LDL Cholesterol: 93 mg/dL (ref 0–99)
Total CHOL/HDL Ratio: 3 RATIO
Triglycerides: 157 mg/dL — ABNORMAL HIGH (ref <150)
VLDL: 31 mg/dL (ref 0–40)

## 2021-10-31 LAB — VITAMIN D 25 HYDROXY (VIT D DEFICIENCY, FRACTURES): Vit D, 25-Hydroxy: 47.57 ng/mL (ref 30–100)

## 2021-10-31 LAB — HEMOGLOBIN A1C
Hgb A1c MFr Bld: 5.8 % — ABNORMAL HIGH (ref 4.8–5.6)
Mean Plasma Glucose: 119.76 mg/dL

## 2021-10-31 LAB — T4, FREE: Free T4: 1.1 ng/dL (ref 0.61–1.12)

## 2021-10-31 LAB — TSH: TSH: 2.705 u[IU]/mL (ref 0.350–4.500)

## 2021-10-31 MED ORDER — BUPROPION HCL ER (XL) 300 MG PO TB24: 300.0000 mg | ORAL_TABLET | Freq: Every day | ORAL | 1 refills | Status: DC

## 2021-10-31 MED ORDER — SERTRALINE HCL 100 MG PO TABS: 100.0000 mg | ORAL_TABLET | Freq: Every day | ORAL | 1 refills | Status: DC

## 2021-10-31 MED ORDER — AMLODIPINE BESYLATE 5 MG PO TABS: 5.0000 mg | ORAL_TABLET | Freq: Every day | ORAL | 1 refills | Status: DC

## 2021-10-31 MED ORDER — LEVOTHYROXINE SODIUM 100 MCG PO TABS: 100.0000 ug | ORAL_TABLET | Freq: Every day | ORAL | 1 refills | Status: DC

## 2021-10-31 MED ORDER — OMEPRAZOLE 40 MG PO CPDR: 40.0000 mg | DELAYED_RELEASE_CAPSULE | Freq: Every day | ORAL | 1 refills | Status: DC

## 2021-10-31 MED ORDER — LOSARTAN POTASSIUM 25 MG PO TABS: 25.0000 mg | ORAL_TABLET | Freq: Every day | ORAL | 1 refills | Status: DC

## 2021-10-31 MED ORDER — EZETIMIBE 10 MG PO TABS: ORAL_TABLET | ORAL | 1 refills | Status: DC

## 2021-10-31 NOTE — Patient Instructions (Signed)
Recommend Eye exam - try [facility] in [place] ? or [facility] in [place] [phone] ?

## 2021-10-31 NOTE — Progress Notes (Signed)
? ? ?Date:  10/31/2021  ? ?Name:  Rachel Dalton   DOB:  10-23-1945   MRN:  [id number] ? ? ?Chief Complaint: Annual Exam (Breast exam no pap ) ?Rachel Dalton is a 76 y.o. female who presents today for her Complete Annual Exam. She feels well. She reports exercising none. She reports she is sleeping well. Breast complaints none. ? ?Mammogram: 03/2018 [facility] aged out ?DEXA: 2019 osteoporosis [facility] - pt declines further exams ?Pap smear: discontinued ?Colonoscopy: 12/2015 [facility] -  tubular adenoma - aged out ? ?Health Maintenance Due  ?Topic Date Due  ? Zoster Vaccines- Shingrix (1 of 2) Never done  ? COVID-19 Vaccine (5 - Booster) 02/08/2020  ?  ?Immunization History  ?Administered Date(s) Administered  ? Hep A / Hep B 06/09/2014, 07/11/2014  ? Hepatitis B, adult 11/24/2014  ? Influenza, High Dose Seasonal PF 03/09/2019, 04/16/2020  ? PFIZER Comirnaty(Gray Top)Covid-19 Tri-Sucrose Vaccine 11/23/2019, 12/14/2019  ? PFIZER(Purple Top)SARS-COV-2 Vaccination 11/23/2019, 12/14/2019  ? Pneumococcal Conjugate-13 02/19/2015  ? Pneumococcal Polysaccharide-23 03/23/2013  ? Tdap 01/18/2014, 06/02/2020  ? Zoster, Live 03/30/2015  ? ? ?Hypertension ?This is a chronic problem. The problem is controlled. Associated symptoms include neck pain. Pertinent negatives include no chest pain, headaches, palpitations or shortness of breath. Past treatments include ACE inhibitors and calcium channel blockers. Hypertensive end-organ damage includes CAD/MI. There is no history of kidney disease or CVA. Identifiable causes of hypertension include a thyroid problem.  ?Thyroid Problem ?Presents for follow-up visit. Symptoms include anxiety. Patient reports no constipation, diarrhea, fatigue, palpitations or tremors. The symptoms have been stable. Her past medical history is significant for hyperlipidemia.  ?Hyperlipidemia ?This is a chronic problem. The problem is controlled. Pertinent negatives include no chest pain or shortness of breath. Current  antihyperlipidemic treatment includes ezetimibe (and Repatha).  ?Depression ?       This is a chronic problem.  The problem has been gradually worsening (more anxiety, irritability) since onset.  Associated symptoms include no fatigue and no headaches.  Past treatments include SSRIs - Selective serotonin reuptake inhibitors (sertraline, buspar and bupropion).  Past medical history includes thyroid problem.   ? ?Lab Results  ?Component Value Date  ? NA 140 09/05/2020  ? K 4.2 09/05/2020  ? CO2 26 (A) 09/05/2020  ? BUN 23 (A) 09/05/2020  ? CREATININE 0.8 09/05/2020  ? CALCIUM 10.1 09/05/2020  ? ?Lab Results  ?Component Value Date  ? CHOL 157 09/05/2020  ? HDL 71 (A) 09/05/2020  ? [place] Level 50 09/05/2020  ? TRIG 180 (A) 09/05/2020  ? ?Lab Results  ?Component Value Date  ? TSH 1.985 07/16/2021  ? ?No results found for: HGBA1C ?Lab Results  ?Component Value Date  ? WBC 7.7 09/05/2020  ? HGB 14.7 09/05/2020  ? HCT 46 09/05/2020  ? PLT 223 09/05/2020  ? ?Lab Results  ?Component Value Date  ? ALT 21 09/05/2020  ? AST 21 09/05/2020  ? ALKPHOS 67 09/05/2020  ? ?No results found for: 25OHVITD2, [place], VD25OH  ? ?Review of Systems  ?Constitutional:  Negative for chills, fatigue and fever.  ?HENT:  Positive for hearing loss. Negative for congestion, tinnitus, trouble swallowing and voice change.   ?Eyes:  Positive for visual disturbance (blurred vision intermittently).  ?Respiratory:  Negative for cough, chest tightness, shortness of breath and wheezing.   ?Cardiovascular:  Negative for chest pain, palpitations and leg swelling.  ?Gastrointestinal:  Negative for abdominal pain, constipation, diarrhea and vomiting.  ?Endocrine: Negative for polydipsia and polyuria.  ?Genitourinary:  Negative for dysuria, frequency, genital sores, vaginal bleeding and vaginal discharge.  ?Musculoskeletal:  Positive for back pain and neck pain. Negative for arthralgias, gait problem and joint swelling.  ?Skin:  Negative for color change and rash.   ?Neurological:  Negative for dizziness, tremors, light-headedness and headaches.  ?Hematological:  Negative for adenopathy. Does not bruise/bleed easily.  ?Psychiatric/Behavioral:  Positive for depression and dysphoric mood. Negative for sleep disturbance. The patient is nervous/anxious.   ? ?Patient Active Problem List  ? Diagnosis Date Noted  ? Thyroid nodule 05/28/2021  ? Other cirrhosis of liver ([place]) 12/11/2020  ? Vitamin D deficiency 12/11/2020  ? GERD without esophagitis 12/11/2020  ? Acquired hypothyroidism 12/11/2020  ? Age-related osteoporosis without current pathological fracture 12/11/2020  ? BMI 40.0-44.9, adult ([place]) 12/11/2020  ? Aortic atherosclerosis ([place]) 12/10/2020  ? Myalgia due to statin 11/22/2019  ? Coronary artery calcification 04/06/2017  ? Depression, major, recurrent, moderate ([place]) 07/29/2016  ? Ascending aorta dilatation (HCC) 06/19/2015  ? 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  ? Essential hypertension 01/18/2014  ? ? ?Allergies  ?Allergen Reactions  ? Topiramate Other (See Comments)  ?  Balance and memory issues  ? Atorvastatin   ?  Other reaction(s): Muscle Pain  ? Lisinopril Cough  ? Pravastatin   ?  Other reaction(s): Muscle Pain  ? Rosuvastatin   ?  Other reaction(s): Muscle Pain  ? Candesartan Other (See Comments)  ?  dizziness  ? Tape Rash  ? ? ?Past Surgical History:  ?Procedure Laterality Date  ? ABDOMINAL HYSTERECTOMY    ? BACK SURGERY    ? CHOLECYSTECTOMY    ? ? ?Social History  ? ?Tobacco Use  ? Smoking status: Never  ? Smokeless tobacco: Never  ?Vaping Use  ? Vaping Use: Never used  ?Substance Use Topics  ? Alcohol use: Yes  ?  Comment: occasionally  ? Drug use: Never  ? ? ? ?Medication list has been reviewed and updated. ? ?Current Meds  ?Medication Sig  ? acetaminophen (TYLENOL) 500 MG tablet Take by mouth.  ? amLODipine (NORVASC) 5 MG tablet Take by mouth.  ? ascorbic acid (VITAMIN C) 1000 MG tablet Take by mouth.  ? aspirin 81 MG EC tablet Take by mouth.  ? buPROPion  (WELLBUTRIN XL) 150 MG 24 hr tablet TAKE 1 TABLET(150 MG) BY MOUTH EVERY DAY  ? busPIRone (BUSPAR) 7.5 MG tablet TAKE 1 TABLET(7.5 MG) BY MOUTH IN THE MORNING AND AT BEDTIME  ? Cholecalciferol (VITAMIN D3 PO) Take by mouth daily.  ? diphenhydrAMINE (BENADRYL) 50 MG capsule Take 50 mg by mouth every 6 (six) hours as needed.  ? Evolocumab (REPATHA SURECLICK) [id number] MG/ML SOAJ INJECT 1 ML UNDER THE SKIN EVERY 14 DAYS  ? ezetimibe (ZETIA) 10 MG tablet TAKE 1 TABLET(10 MG) BY MOUTH EVERY DAY  ? fluticasone (FLONASE) 50 MCG/ACT nasal spray Place 2 sprays into both nostrils daily.  ? levothyroxine (SYNTHROID) 100 MCG tablet Take by mouth.  ? losartan (COZAAR) 25 MG tablet Take 1 tablet by mouth daily.  ? Multiple Vitamin (MULTI-VITAMIN) tablet Take by mouth.  ? multivitamin-lutein (OCUVITE-LUTEIN) CAPS capsule Take 1 capsule by mouth daily.  ? Omega-3 Fatty Acids (FISH OIL) 1200 MG CAPS Take by mouth daily.  ? omeprazole (PRILOSEC) 40 MG capsule Take 1 capsule (40 mg total) by mouth daily.  ? sertraline (ZOLOFT) 100 MG tablet Take 1 tablet by mouth daily.  ? ? ? ?  10/31/2021  ? 11:32 AM 05/28/2021  ?  1:47 PM 03/15/2021  ?  2:50 PM 12/11/2020  ?  2:28 PM  ?GAD 7 : Generalized Anxiety Score  ?Nervous, Anxious, on Edge 2 1 0 2  ?Control/stop worrying 2 1 0 1  ?Worry too much - different things 2 1 0 1  ?Trouble relaxing 2 1 2 2   ?Restless 3 1 1 1   ?Easily annoyed or irritable 0 1 1 1   ?Afraid - awful might happen 0 0 0 0  ?Total GAD 7 Score 11 6 4 8   ?Anxiety Difficulty  Not difficult at all    ? ? ? ?  10/31/2021  ? 11:32 AM  ?Depression screen PHQ 2/9  ?Decreased Interest 3  ?Down, Depressed, Hopeless 3  ?PHQ - 2 Score 6  ?Altered sleeping 3  ?Tired, decreased energy 3  ?Change in appetite 3  ?Feeling bad or failure about yourself  3  ?Trouble concentrating 3  ?Moving slowly or fidgety/restless 3  ?Suicidal thoughts 0  ?PHQ-9 Score 24  ?Difficult doing work/chores Very difficult  ? ? ?BP Readings from Last 3 Encounters:   ?10/31/21 120/82  ?07/16/21 136/86  ?05/28/21 134/88  ? ? ?Physical Exam ?Vitals and nursing note reviewed.  ?Constitutional:   ?   General: She is not in acute distress. ?   Appearance: She is well-developed. She is obes

## 2021-11-01 ENCOUNTER — Encounter: Payer: Self-pay | Admitting: Internal Medicine

## 2021-11-01 DIAGNOSIS — R7303 Prediabetes: Secondary | ICD-10-CM | POA: Insufficient documentation

## 2021-11-05 NOTE — Progress Notes (Signed)
I did not send this patient to [facility].  Therefore I could not hand write any diagnosis on the order.  In the future, if I know the patient is going to [facility], I will do so. ?

## 2021-11-20 ENCOUNTER — Ambulatory Visit (INDEPENDENT_AMBULATORY_CARE_PROVIDER_SITE_OTHER): Payer: Medicare PPO

## 2021-11-20 DIAGNOSIS — Z0000 Encounter for general adult medical examination without abnormal findings: Secondary | ICD-10-CM

## 2021-11-20 NOTE — Patient Instructions (Signed)
Rachel Dalton , Thank you for taking time to come for your Medicare Wellness Visit. I appreciate your ongoing commitment to your health goals. Please review the following plan we discussed and let me know if I can assist you in the future.   Screening recommendations/referrals: Colonoscopy: no longer required Mammogram: no longer required Bone Density: no longer required Recommended yearly ophthalmology/optometry visit for glaucoma screening and checkup Recommended yearly dental visit for hygiene and checkup  Vaccinations: Influenza vaccine: due fall 2023 Pneumococcal vaccine: done 02/19/15 Tdap vaccine: done 06/02/20 Shingles vaccine: Shingrix discussed. Please contact your pharmacy for coverage information.  Covid-19:done 11/23/19 & 12/14/19  Advanced directives: Please bring a copy of your health care power of attorney and living will to the office at your convenience once you have completed that paperwork.   Conditions/risks identified: Recommend increasing physical activity   Next appointment: Follow up in one year for your annual wellness visit    Preventive Care 65 Years and Older, Female Preventive care refers to lifestyle choices and visits with your health care provider that can promote health and wellness. What does preventive care include? A yearly physical exam. This is also called an annual well check. Dental exams once or twice a year. Routine eye exams. Ask your health care provider how often you should have your eyes checked. Personal lifestyle choices, including: Daily care of your teeth and gums. Regular physical activity. Eating a healthy diet. Avoiding tobacco and drug use. Limiting alcohol use. Practicing safe sex. Taking low-dose aspirin every day. Taking vitamin and mineral supplements as recommended by your health care provider. What happens during an annual well check? The services and screenings done by your health care provider during your annual well  check will depend on your age, overall health, lifestyle risk factors, and family history of disease. Counseling  Your health care provider may ask you questions about your: Alcohol use. Tobacco use. Drug use. Emotional well-being. Home and relationship well-being. Sexual activity. Eating habits. History of falls. Memory and ability to understand (cognition). Work and work Astronomer. Reproductive health. Screening  You may have the following tests or measurements: Height, weight, and BMI. Blood pressure. Lipid and cholesterol levels. These may be checked every 5 years, or more frequently if you are over 20 years old. Skin check. Lung cancer screening. You may have this screening every year starting at age 7 if you have a 30-pack-year history of smoking and currently smoke or have quit within the past 15 years. Fecal occult blood test (FOBT) of the stool. You may have this test every year starting at age 7. Flexible sigmoidoscopy or colonoscopy. You may have a sigmoidoscopy every 5 years or a colonoscopy every 10 years starting at age 63. Hepatitis C blood test. Hepatitis B blood test. Sexually transmitted disease (STD) testing. Diabetes screening. This is done by checking your blood sugar (glucose) after you have not eaten for a while (fasting). You may have this done every 1-3 years. Bone density scan. This is done to screen for osteoporosis. You may have this done starting at age 14. Mammogram. This may be done every 1-2 years. Talk to your health care provider about how often you should have regular mammograms. Talk with your health care provider about your test results, treatment options, and if necessary, the need for more tests. Vaccines  Your health care provider may recommend certain vaccines, such as: Influenza vaccine. This is recommended every year. Tetanus, diphtheria, and acellular pertussis (Tdap, Td) vaccine. You may need  a Td booster every 10 years. Zoster  vaccine. You may need this after age 30. Pneumococcal 13-valent conjugate (PCV13) vaccine. One dose is recommended after age 83. Pneumococcal polysaccharide (PPSV23) vaccine. One dose is recommended after age 41. Talk to your health care provider about which screenings and vaccines you need and how often you need them. This information is not intended to replace advice given to you by your health care provider. Make sure you discuss any questions you have with your health care provider. Document Released: 07/06/2015 Document Revised: 02/27/2016 Document Reviewed: 04/10/2015 Elsevier Interactive Patient Education  2017 [place] Prevention in the Home Falls can cause injuries. They can happen to people of all ages. There are many things you can do to make your home safe and to help prevent falls. What can I do on the outside of my home? Regularly fix the edges of walkways and driveways and fix any cracks. Remove anything that might make you trip as you walk through a door, such as a raised step or threshold. Trim any bushes or trees on the path to your home. Use bright outdoor lighting. Clear any walking paths of anything that might make someone trip, such as rocks or tools. Regularly check to see if handrails are loose or broken. Make sure that both sides of any steps have handrails. Any raised decks and porches should have guardrails on the edges. Have any leaves, snow, or ice cleared regularly. Use sand or salt on walking paths during winter. Clean up any spills in your garage right away. This includes oil or grease spills. What can I do in the bathroom? Use night lights. Install grab bars by the toilet and in the tub and shower. Do not use towel bars as grab bars. Use non-skid mats or decals in the tub or shower. If you need to sit down in the shower, use a plastic, non-slip stool. Keep the floor dry. Clean up any water that spills on the floor as soon as it happens. Remove  soap buildup in the tub or shower regularly. Attach bath mats securely with double-sided non-slip rug tape. Do not have throw rugs and other things on the floor that can make you trip. What can I do in the bedroom? Use night lights. Make sure that you have a light by your bed that is easy to reach. Do not use any sheets or blankets that are too big for your bed. They should not hang down onto the floor. Have a firm chair that has side arms. You can use this for support while you get dressed. Do not have throw rugs and other things on the floor that can make you trip. What can I do in the kitchen? Clean up any spills right away. Avoid walking on wet floors. Keep items that you use a lot in easy-to-reach places. If you need to reach something above you, use a strong step stool that has a grab bar. Keep electrical cords out of the way. Do not use floor polish or wax that makes floors slippery. If you must use wax, use non-skid floor wax. Do not have throw rugs and other things on the floor that can make you trip. What can I do with my stairs? Do not leave any items on the stairs. Make sure that there are handrails on both sides of the stairs and use them. Fix handrails that are broken or loose. Make sure that handrails are as long as the stairways. Check  any carpeting to make sure that it is firmly attached to the stairs. Fix any carpet that is loose or worn. Avoid having throw rugs at the top or bottom of the stairs. If you do have throw rugs, attach them to the floor with carpet tape. Make sure that you have a light switch at the top of the stairs and the bottom of the stairs. If you do not have them, ask someone to add them for you. What else can I do to help prevent falls? Wear shoes that: Do not have high heels. Have rubber bottoms. Are comfortable and fit you well. Are closed at the toe. Do not wear sandals. If you use a stepladder: Make sure that it is fully opened. Do not climb a  closed stepladder. Make sure that both sides of the stepladder are locked into place. Ask someone to hold it for you, if possible. Clearly mark and make sure that you can see: Any grab bars or handrails. First and last steps. Where the edge of each step is. Use tools that help you move around (mobility aids) if they are needed. These include: Canes. Walkers. Scooters. Crutches. Turn on the lights when you go into a dark area. Replace any light bulbs as soon as they burn out. Set up your furniture so you have a clear path. Avoid moving your furniture around. If any of your floors are uneven, fix them. If there are any pets around you, be aware of where they are. Review your medicines with your doctor. Some medicines can make you feel dizzy. This can increase your chance of falling. Ask your doctor what other things that you can do to help prevent falls. This information is not intended to replace advice given to you by your health care provider. Make sure you discuss any questions you have with your health care provider. Document Released: 04/05/2009 Document Revised: 11/15/2015 Document Reviewed: 07/14/2014 Elsevier Interactive Patient Education  2017 [employer].

## 2021-11-20 NOTE — Progress Notes (Signed)
Subjective:   Rachel Dalton is a 76 y.o. female who presents for Medicare Annual (Subsequent) preventive examination.  Virtual Visit via Telephone Note  I connected with  Rachel Dalton on 11/20/21 at  1:00 PM EDT by telephone and verified that I am speaking with the correct person using two identifiers.  Location: Patient: home Provider: [facility] Persons participating in the virtual visit: [place]   I discussed the limitations, risks, security and privacy concerns of performing an evaluation and management service by telephone and the availability of in person appointments. The patient expressed understanding and agreed to proceed.  Interactive audio and video telecommunications were attempted between this nurse and patient, however failed, due to patient having technical difficulties OR patient did not have access to video capability.  We continued and completed visit with audio only.  Some vital signs may be absent or patient reported.   Clemetine Marker, LPN   Review of Systems     Cardiac Risk Factors include: advanced age (>39men, >43 women);hypertension;dyslipidemia;obesity (BMI >30kg/m2)     Objective:    There were no vitals filed for this visit. There is no height or weight on file to calculate BMI.     11/20/2021   12:53 PM  Advanced Directives  Does Patient Have a Medical Advance Directive? No  Would patient like information on creating a medical advance directive? No - Patient declined    Current Medications (verified) Outpatient Encounter Medications as of 11/20/2021  Medication Sig   acetaminophen (TYLENOL) 500 MG tablet Take by mouth.   amLODipine (NORVASC) 5 MG tablet Take 1 tablet (5 mg total) by mouth daily.   ascorbic acid (VITAMIN C) 1000 MG tablet Take by mouth.   aspirin 81 MG EC tablet Take by mouth.   buPROPion (WELLBUTRIN XL) 300 MG 24 hr tablet Take 1 tablet (300 mg total) by mouth daily.   busPIRone (BUSPAR) 7.5 MG tablet TAKE 1  TABLET(7.5 MG) BY MOUTH IN THE MORNING AND AT BEDTIME   Cholecalciferol (VITAMIN D3 PO) Take by mouth daily.   diphenhydrAMINE (BENADRYL) 50 MG capsule Take 50 mg by mouth every 6 (six) hours as needed.   Evolocumab (REPATHA SURECLICK) [id number] MG/ML SOAJ INJECT 1 ML UNDER THE SKIN EVERY 14 DAYS   ezetimibe (ZETIA) 10 MG tablet TAKE 1 TABLET(10 MG) BY MOUTH EVERY DAY   fluticasone (FLONASE) 50 MCG/ACT nasal spray Place 2 sprays into both nostrils daily.   levothyroxine (SYNTHROID) 100 MCG tablet Take 1 tablet (100 mcg total) by mouth daily before breakfast.   losartan (COZAAR) 25 MG tablet Take 1 tablet (25 mg total) by mouth daily.   Multiple Vitamin (MULTI-VITAMIN) tablet Take by mouth.   multivitamin-lutein (OCUVITE-LUTEIN) CAPS capsule Take 1 capsule by mouth daily.   Omega-3 Fatty Acids (FISH OIL) 1200 MG CAPS Take by mouth daily.   omeprazole (PRILOSEC) 40 MG capsule Take 1 capsule (40 mg total) by mouth daily.   sertraline (ZOLOFT) 100 MG tablet Take 1 tablet (100 mg total) by mouth daily.   No facility-administered encounter medications on file as of 11/20/2021.    Allergies (verified) Topiramate, Atorvastatin, Lisinopril, Pravastatin, Rosuvastatin, Candesartan, and Tape   History: Past Medical History:  Diagnosis Date   Allergy    Ascending aorta dilatation (HCC)    Chronic hepatitis C with cirrhosis (HCC)    Coronary artery calcification    Depression    DOE (dyspnea on exertion)    Hyperlipidemia    Hypertension    Statin  intolerance    Past Surgical History:  Procedure Laterality Date   ABDOMINAL HYSTERECTOMY     CHOLECYSTECTOMY     LUMBAR [place] SURGERY  2010   Family History  Problem Relation Age of Onset   Hypertension Mother    Heart disease Mother    Diabetes Mother    Hypertension Maternal Grandmother    Heart disease Maternal Grandmother    Social History   Socioeconomic History   Marital status: Widowed    Spouse name: Not on file   Number of children:  0   Years of education: Not on file   Highest education level: Not on file  Occupational History   Not on file  Tobacco Use   Smoking status: Never   Smokeless tobacco: Never  Vaping Use   Vaping Use: Never used  Substance and Sexual Activity   Alcohol use: Yes    Comment: occasionally   Drug use: Never   Sexual activity: Not Currently  Other Topics Concern   Not on file  Social History Narrative   Pt's sister in law lives with her   Social Determinants of Health   Financial Resource Strain: Low Risk    Difficulty of Paying Living Expenses: Not hard at all  Food Insecurity: No Food Insecurity   Worried About Charity fundraiser in the Last Year: Never true   Arboriculturist in the Last Year: Never true  Transportation Needs: No Transportation Needs   Lack of Transportation (Medical): No   Lack of Transportation (Non-Medical): No  Physical Activity: Inactive   Days of Exercise per Week: 0 days   Minutes of Exercise per Session: 0 min  Stress: No Stress Concern Present   Feeling of Stress : Not at all  Social Connections: Socially Isolated   Frequency of Communication with Friends and Family: More than three times a week   Frequency of Social Gatherings with Friends and Family: Twice a week   Attends Religious Services: Never   Marine scientist or Organizations: No   Attends Archivist Meetings: Never   Marital Status: Widowed    Tobacco Counseling Counseling given: Not Answered   Clinical Intake:  Pre-visit preparation completed: Yes  Pain : No/denies pain     Nutritional Risks: None Diabetes: No  How often do you need to have someone help you when you read instructions, pamphlets, or other written materials from your doctor or pharmacy?: 1 - Never    Interpreter Needed?: No  Information entered by :: Clemetine Marker LPN   Activities of Daily Living    11/20/2021   12:55 PM 05/28/2021    1:47 PM  In your present state of health, do you  have any difficulty performing the following activities:  Hearing? 0 1  Vision? 1 1  Difficulty concentrating or making decisions? 0 1  Walking or climbing stairs? 1 1  Dressing or bathing? 0 0  Doing errands, shopping? 0 0  Preparing Food and eating ? N   Using the Toilet? N   In the past six months, have you accidently leaked urine? N   Do you have problems with loss of bowel control? N   Managing your Medications? N   Managing your Finances? N   Housekeeping or managing your Housekeeping? N     Patient Care Team: Glean Hess, MD as PCP - General (Internal Medicine) Leonia Reeves, MD as Referring Physician (Cardiology)  Indicate any recent Medical Services  you may have received from other than [facility] providers in the past year (date may be approximate).     Assessment:   This is a routine wellness examination for [facility].  Hearing/Vision screen Hearing Screening - Comments:: Pt c/o occasional mild hearing difficulty  Vision Screening - Comments:: Due for eye exam; plans to establish care with new provider  Dietary issues and exercise activities discussed: Current Exercise Habits: The patient does not participate in regular exercise at present, Exercise limited by: None identified   Goals Addressed   None    Depression Screen    11/20/2021   12:49 PM 10/31/2021   11:32 AM 05/28/2021    1:47 PM 03/15/2021    2:50 PM 12/11/2020    2:26 PM  PHQ 2/9 Scores  PHQ - 2 Score 2 6 4 6 6   PHQ- 9 Score 9 24 17 15 19     Fall Risk    11/20/2021   12:55 PM 10/31/2021   11:32 AM 05/28/2021    1:47 PM 03/15/2021    2:48 PM 12/11/2020    2:29 PM  Fall Risk   Falls in the past year? 1 1 1 1 1   Number falls in past yr: 0 0 1 1 1   Injury with Fall? 0 0 0 1 1  Risk for fall due to : History of fall(s) History of fall(s) History of fall(s);Impaired balance/gait History of fall(s)   Follow up Falls prevention discussed Falls evaluation completed Falls evaluation completed  Falls evaluation completed Falls evaluation completed    FALL RISK PREVENTION PERTAINING TO THE HOME:  Any stairs in or around the home? Yes  If so, are there any without handrails? Yes  Home free of loose throw rugs in walkways, pet beds, electrical cords, etc? Yes  Adequate lighting in your home to reduce risk of falls? Yes   ASSISTIVE DEVICES UTILIZED TO PREVENT FALLS:  Life alert? No  Use of a cane, walker or w/c? No Grab bars in the bathroom? Yes  Shower chair or bench in shower? No  Elevated toilet seat or a handicapped toilet? No   TIMED UP AND GO:  Was the test performed? No . Telephonic visit  Cognitive Function: Normal cognitive status assessed by direct observation by this Nurse Health Advisor. No abnormalities found.          Immunizations Immunization History  Administered Date(s) Administered   Hep A / Hep B 06/09/2014, 07/11/2014   Hepatitis B, adult 11/24/2014   Influenza, High Dose Seasonal PF 03/09/2019, 04/16/2020   PFIZER(Purple Top)SARS-COV-2 Vaccination 11/23/2019, 12/14/2019   Pneumococcal Conjugate-13 02/19/2015   Pneumococcal Polysaccharide-23 03/23/2013   Tdap 01/18/2014, 06/02/2020   Zoster, Live 03/30/2015    TDAP status: Up to date  Flu Vaccine status: Up to date  Pneumococcal vaccine status: Up to date  Covid-19 vaccine status: Completed vaccines  Qualifies for Shingles Vaccine? Yes   Zostavax completed Yes   Shingrix Completed?: No.    Education has been provided regarding the importance of this vaccine. Patient has been advised to call insurance company to determine out of pocket expense if they have not yet received this vaccine. Advised may also receive vaccine at local pharmacy or Health Dept. Verbalized acceptance and understanding.  Screening Tests Health Maintenance  Topic Date Due   COVID-19 Vaccine (3 - Booster for Pfizer series) 12/06/2021 (Originally 02/08/2020)   INFLUENZA VACCINE  01/21/2022   COLONOSCOPY (Pts  45-69yrs Insurance coverage will need to be confirmed)  01/06/2026  TETANUS/TDAP  06/02/2030   Pneumonia Vaccine 66+ Years old  Completed   DEXA SCAN  Completed   Hepatitis C Screening  Completed   HPV VACCINES  Aged Out   Zoster Vaccines- Shingrix  Discontinued    Health Maintenance  There are no preventive care reminders to display for this patient.   Colorectal cancer screening: No longer required.   Mammogram status: No longer required due to age.  Bone density status: no longer required due to age  Lung Cancer Screening: (Low Dose CT Chest recommended if Age 62-80 years, 30 pack-year currently smoking OR have quit w/in 15years.) does not qualify.   Additional Screening:  Hepatitis C Screening: does qualify; Completed 12/11/20  Vision Screening: Recommended annual ophthalmology exams for early detection of glaucoma and other disorders of the eye. Is the patient up to date with their annual eye exam?  No  Who is the provider or what is the name of the office in which the patient attends annual eye exams? Not established If pt is not established with a provider, would they like to be referred to a provider to establish care? No .   Dental Screening: Recommended annual dental exams for proper oral hygiene  Community Resource Referral / Chronic Care Management: CRR required this visit?  No   CCM required this visit?  No      Plan:     I have personally reviewed and noted the following in the patient's chart:   Medical and social history Use of alcohol, tobacco or illicit drugs  Current medications and supplements including opioid prescriptions.  Functional ability and status Nutritional status Physical activity Advanced directives List of other physicians Hospitalizations, surgeries, and ER visits in previous 12 months Vitals Screenings to include cognitive, depression, and falls Referrals and appointments  In addition, I have reviewed and discussed with  patient certain preventive protocols, quality metrics, and best practice recommendations. A written personalized care plan for preventive services as well as general preventive health recommendations were provided to patient.     Clemetine Marker, LPN   [id number]   Nurse Notes: none

## 2022-01-06 ENCOUNTER — Ambulatory Visit (INDEPENDENT_AMBULATORY_CARE_PROVIDER_SITE_OTHER): Payer: Medicare PPO | Admitting: Internal Medicine

## 2022-01-06 ENCOUNTER — Encounter: Payer: Self-pay | Admitting: Internal Medicine

## 2022-01-06 VITALS — BP 122/84 | HR 86 | Ht 66.0 in | Wt 261.0 lb

## 2022-01-06 DIAGNOSIS — F331 Major depressive disorder, recurrent, moderate: Secondary | ICD-10-CM | POA: Diagnosis not present

## 2022-01-06 DIAGNOSIS — K219 Gastro-esophageal reflux disease without esophagitis: Secondary | ICD-10-CM | POA: Diagnosis not present

## 2022-01-06 DIAGNOSIS — R269 Unspecified abnormalities of gait and mobility: Secondary | ICD-10-CM | POA: Diagnosis not present

## 2022-01-06 DIAGNOSIS — I700 Atherosclerosis of aorta: Secondary | ICD-10-CM | POA: Diagnosis not present

## 2022-01-06 DIAGNOSIS — I10 Essential (primary) hypertension: Secondary | ICD-10-CM

## 2022-01-06 MED ORDER — OMEPRAZOLE 40 MG PO CPDR: 40.0000 mg | DELAYED_RELEASE_CAPSULE | Freq: Every day | ORAL | 1 refills | Status: DC

## 2022-01-06 MED ORDER — EZETIMIBE 10 MG PO TABS: ORAL_TABLET | ORAL | 1 refills | Status: AC

## 2022-01-06 MED ORDER — BUSPIRONE HCL 7.5 MG PO TABS: 7.5000 mg | ORAL_TABLET | Freq: Two times a day (BID) | ORAL | 3 refills | Status: DC

## 2022-01-06 NOTE — Progress Notes (Addendum)
Date:  01/06/2022   Name:  Rachel Dalton   DOB:  08-May-1946   MRN:  [id number]   Chief Complaint: Depression  Depression        This is a recurrent problem.  The problem has been gradually improving since onset.  Associated symptoms include decreased concentration, hopelessness and decreased interest.  Associated symptoms include no fatigue and no headaches.     The symptoms are aggravated by social issues.  Past treatments include SSRIs - Selective serotonin reuptake inhibitors (buspar and Wellbutrin (dose increased last visit)).  Compliance with treatment is good.  Previous treatment provided moderate relief. Hypertension This is a chronic problem. The problem is controlled. Pertinent negatives include no chest pain, headaches or shortness of breath.  Gastroesophageal Reflux She complains of heartburn. She reports no abdominal pain or no chest pain. This is a recurrent problem. The problem occurs rarely. Pertinent negatives include no fatigue. She has tried a PPI for the symptoms.  Gait disturbance - she has frequent episodes of balance loss - feels a little lightheaded at times but not in relation to the falls.  Larey Seat last week onto her stomach and sustained a bruise but did not hit her head.  She is now using a cane outside the house.   Lab Results  Component Value Date   NA 138 10/31/2021   K 3.5 10/31/2021   CO2 24 10/31/2021   GLUCOSE 122 (H) 10/31/2021   BUN 19 10/31/2021   CREATININE 0.86 10/31/2021   CALCIUM 9.7 10/31/2021   GFRNONAA >60 10/31/2021   Lab Results  Component Value Date   CHOL 186 10/31/2021   HDL 62 10/31/2021   LDLCALC 93 10/31/2021   TRIG 157 (H) 10/31/2021   CHOLHDL 3.0 10/31/2021   Lab Results  Component Value Date   TSH 2.705 10/31/2021   Lab Results  Component Value Date   HGBA1C 5.8 (H) 10/31/2021   Lab Results  Component Value Date   WBC 5.8 10/31/2021   HGB 14.4 10/31/2021   HCT 41.5 10/31/2021   MCV 88.1 10/31/2021   PLT 232  10/31/2021   Lab Results  Component Value Date   ALT 25 10/31/2021   AST 25 10/31/2021   ALKPHOS 74 10/31/2021   BILITOT 0.4 10/31/2021   Lab Results  Component Value Date   VD25OH 47.57 10/31/2021     Review of Systems  Constitutional:  Negative for chills, fatigue and fever.  Respiratory:  Negative for chest tightness and shortness of breath.   Cardiovascular:  Positive for leg swelling (esp left ankle). Negative for chest pain.  Gastrointestinal:  Positive for heartburn. Negative for abdominal pain.  Musculoskeletal:  Positive for arthralgias and gait problem.  Neurological:  Positive for syncope, weakness and light-headedness. Negative for dizziness and headaches.  Psychiatric/Behavioral:  Positive for decreased concentration, depression and dysphoric mood. Negative for sleep disturbance. The patient is nervous/anxious.     Patient Active Problem List   Diagnosis Date Noted   Prediabetes 11/01/2021   Chronic neck pain 10/31/2021   Thyroid nodule 05/28/2021   Other cirrhosis of liver (HCC) 12/11/2020   Vitamin D deficiency 12/11/2020   GERD without esophagitis 12/11/2020   Acquired hypothyroidism 12/11/2020   Age-related osteoporosis without current pathological fracture 12/11/2020   BMI 40.0-44.9, adult (HCC) 12/11/2020   Aortic atherosclerosis (HCC) 12/10/2020   Myalgia due to statin 11/22/2019   Coronary artery calcification 04/06/2017   Depression, major, recurrent, moderate (HCC) 07/29/2016   Ascending aorta dilatation (HCC)  06/19/2015   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   Essential hypertension 01/18/2014    Allergies  Allergen Reactions   Topiramate Other (See Comments)    Balance and memory issues   Atorvastatin     Other reaction(s): Muscle Pain   Lisinopril Cough   Pravastatin     Other reaction(s): Muscle Pain   Rosuvastatin     Other reaction(s): Muscle Pain   Candesartan Other (See Comments)    dizziness   Tape Rash    Past Surgical  History:  Procedure Laterality Date   ABDOMINAL HYSTERECTOMY     CHOLECYSTECTOMY     LUMBAR DISC SURGERY  2010    Social History   Tobacco Use   Smoking status: Never   Smokeless tobacco: Never  Vaping Use   Vaping Use: Never used  Substance Use Topics   Alcohol use: Yes    Comment: occasionally   Drug use: Never     Medication list has been reviewed and updated.  Current Meds  Medication Sig   acetaminophen (TYLENOL) 500 MG tablet Take by mouth.   amLODipine (NORVASC) 5 MG tablet Take 1 tablet (5 mg total) by mouth daily.   ascorbic acid (VITAMIN C) 1000 MG tablet Take by mouth.   aspirin 81 MG EC tablet Take by mouth.   buPROPion (WELLBUTRIN XL) 300 MG 24 hr tablet Take 1 tablet (300 mg total) by mouth daily.   Cholecalciferol (VITAMIN D3 PO) Take by mouth daily.   Evolocumab (REPATHA SURECLICK) 140 MG/ML SOAJ INJECT 1 ML UNDER THE SKIN EVERY 14 DAYS   fluticasone (FLONASE) 50 MCG/ACT nasal spray Place 2 sprays into both nostrils daily.   levothyroxine (SYNTHROID) 100 MCG tablet Take 1 tablet (100 mcg total) by mouth daily before breakfast.   losartan (COZAAR) 25 MG tablet Take 1 tablet (25 mg total) by mouth daily.   Mometasone Furoate (PROPEL NA) Place into the nose as needed. Add to water   Multiple Vitamin (MULTI-VITAMIN) tablet Take by mouth.   multivitamin-lutein (OCUVITE-LUTEIN) CAPS capsule Take 1 capsule by mouth daily.   sertraline (ZOLOFT) 100 MG tablet Take 1 tablet (100 mg total) by mouth daily.   Tetrahydrozoline HCl (PX STERILE EYE DROPS OP) Apply to eye daily.   [DISCONTINUED] busPIRone (BUSPAR) 7.5 MG tablet TAKE 1 TABLET(7.5 MG) BY MOUTH IN THE MORNING AND AT BEDTIME   [DISCONTINUED] diphenhydrAMINE (BENADRYL) 50 MG capsule Take 50 mg by mouth every 6 (six) hours as needed.   [DISCONTINUED] ezetimibe (ZETIA) 10 MG tablet TAKE 1 TABLET(10 MG) BY MOUTH EVERY DAY   [DISCONTINUED] omeprazole (PRILOSEC) 40 MG capsule Take 1 capsule (40 mg total) by mouth  daily.       01/06/2022   11:50 AM 10/31/2021   11:32 AM 05/28/2021    1:47 PM 03/15/2021    2:50 PM  GAD 7 : Generalized Anxiety Score  Nervous, Anxious, on Edge 3 2 1  0  Control/stop worrying 1 2 1  0  Worry too much - different things 0 2 1 0  Trouble relaxing 2 2 1 2   Restless 1 3 1 1   Easily annoyed or irritable 1 0 1 1  Afraid - awful might happen 0 0 0 0  Total GAD 7 Score 8 11 6 4   Anxiety Difficulty Somewhat difficult  Not difficult at all        01/06/2022   11:49 AM 11/20/2021   12:49 PM 10/31/2021   11:32 AM  Depression screen PHQ 2/9  Decreased Interest 2  1 3  Down, Depressed, Hopeless 3 1 3   PHQ - 2 Score 5 2 6   Altered sleeping 3 0 3  Tired, decreased energy 3 3 3   Change in appetite 2 0 3  Feeling bad or failure about yourself  0 0 3  Trouble concentrating 2 2 3   Moving slowly or fidgety/restless 0 2 3  Suicidal thoughts 0 0 0  PHQ-9 Score 15 9 24   Difficult doing work/chores Very difficult Not difficult at all Very difficult    BP Readings from Last 3 Encounters:  01/06/22 122/84  10/31/21 120/82  07/16/21 136/86    Physical Exam Vitals and nursing note reviewed.  Constitutional:      General: She is not in acute distress.    Appearance: She is well-developed. She is obese.  HENT:     Head: Normocephalic and atraumatic.  Cardiovascular:     Rate and Rhythm: Normal rate and regular rhythm.  Pulmonary:     Effort: Pulmonary effort is normal. No respiratory distress.     Breath sounds: No wheezing or rhonchi.  Abdominal:     General: There is no distension.     Palpations: Abdomen is soft. There is no mass.     Tenderness: There is no abdominal tenderness.     Comments: Large bruise in the suprapubic area  Skin:    General: Skin is warm and dry.     Findings: No rash.  Neurological:     Mental Status: She is alert and oriented to person, place, and time.  Psychiatric:        Mood and Affect: Mood normal.        Behavior: Behavior normal.      Wt Readings from Last 3 Encounters:  01/06/22 261 lb (118.4 kg)  10/31/21 260 lb (117.9 kg)  05/28/21 264 lb (119.7 kg)    BP 122/84   Pulse 86   Ht 5\' 6"  (1.676 m)   Wt 261 lb (118.4 kg)   LMP  (LMP Unknown)   SpO2 95%   BMI 42.13 kg/m   Assessment and Plan: 1. Depression, major, recurrent, moderate (HCC) Improved symptoms with change in medication Continue current therapy - busPIRone (BUSPAR) 7.5 MG tablet; Take 1 tablet (7.5 mg total) by mouth 2 (two) times daily.  Dispense: 180 tablet; Refill: 3  2. GERD without esophagitis Symptoms well controlled on daily PPI No red flag signs such as weight loss, n/v, melena Will continue same therapy - omeprazole (PRILOSEC) 40 MG capsule; Take 1 capsule (40 mg total) by mouth daily.  Dispense: 90 capsule; Refill: 1  3. Essential hypertension Clinically stable exam with well controlled BP.  Not quite at goal but will not adjust medications due to recent falls. Tolerating medications without side effects at this time. Pt to continue current regimen and low sodium diet; benefits of regular exercise as able discussed.  4. Gait disturbance Suspect deconditioning along with decreased balance due to age Recommend PT referral but she declines at this time.  5. Aortic atherosclerosis (HCC) - ezetimibe (ZETIA) 10 MG tablet; TAKE 1 TABLET(10 MG) BY MOUTH EVERY DAY  Dispense: 90 tablet; Refill: 1   Partially dictated using 12/31/21. Any errors are unintentional.  07/18/21, MD [facility]  01/06/2022

## 2022-01-24 ENCOUNTER — Other Ambulatory Visit: Payer: Self-pay | Admitting: Internal Medicine

## 2022-01-24 DIAGNOSIS — I10 Essential (primary) hypertension: Secondary | ICD-10-CM

## 2022-01-24 DIAGNOSIS — F331 Major depressive disorder, recurrent, moderate: Secondary | ICD-10-CM

## 2022-01-24 NOTE — Telephone Encounter (Signed)
Requested Prescriptions  Pending Prescriptions Disp Refills  . buPROPion (WELLBUTRIN XL) 300 MG 24 hr tablet [Pharmacy Med Name: BUPROPION XL 300MG  TABLETS] 90 tablet 1    Sig: TAKE 1 TABLET(300 MG) BY MOUTH DAILY     Psychiatry: Antidepressants - bupropion Passed - 01/24/2022 12:14 PM      Passed - Cr in normal range and within 360 days    Creatinine, Ser  Date Value Ref Range Status  10/31/2021 0.86 0.44 - 1.00 mg/dL Final         Passed - AST in normal range and within 360 days    AST  Date Value Ref Range Status  10/31/2021 25 15 - 41 U/L Final         Passed - ALT in normal range and within 360 days    ALT  Date Value Ref Range Status  10/31/2021 25 0 - 44 U/L Final         Passed - Completed PHQ-2 or PHQ-9 in the last 360 days      Passed - Last BP in normal range    BP Readings from Last 1 Encounters:  01/06/22 122/84         Passed - Valid encounter within last 6 months    Recent Outpatient Visits          2 weeks ago Depression, major, recurrent, moderate (HCC)   [facility] 01/08/22, MD   2 months ago Annual physical exam   [facility], MD   8 months ago Essential hypertension   [facility], MD   10 months ago Essential hypertension   [facility], MD   1 year ago Essential hypertension   [facility], MD      Future Appointments            In 4 months Reubin Milan Judithann Graves, MD [facility]           . sertraline (ZOLOFT) 100 MG tablet [Pharmacy Med Name: SERTRALINE 100MG  TABLETS] 90 tablet 1    Sig: TAKE 1 TABLET(100 MG) BY MOUTH DAILY     Psychiatry:  Antidepressants - SSRI - sertraline Passed - 01/24/2022 12:14 PM      Passed - AST in normal range and within 360 days    AST  Date Value Ref Range Status  10/31/2021 25 15 - 41 U/L Final         Passed - ALT in normal range and within 360 days    ALT  Date  Value Ref Range Status  10/31/2021 25 0 - 44 U/L Final         Passed - Completed PHQ-2 or PHQ-9 in the last 360 days      Passed - Valid encounter within last 6 months    Recent Outpatient Visits          2 weeks ago Depression, major, recurrent, moderate (HCC)   [facility] 12/31/2021, MD   2 months ago Annual physical exam   [facility] Reubin Milan, MD   8 months ago Essential hypertension   [facility] Reubin Milan, MD   10 months ago Essential hypertension   [facility] Reubin Milan, MD   1 year ago Essential hypertension   [facility] Reubin Milan, MD      Future Appointments  In 4 months Reubin Milan, MD [facility]           . amLODipine (NORVASC) 5 MG tablet [Pharmacy Med Name: AMLODIPINE BESYLATE 5MG  TABLETS] 90 tablet 1    Sig: TAKE 1 TABLET(5 MG) BY MOUTH DAILY     Cardiovascular: Calcium Channel Blockers 2 Passed - 01/24/2022 12:14 PM      Passed - Last BP in normal range    BP Readings from Last 1 Encounters:  01/06/22 122/84         Passed - Last Heart Rate in normal range    Pulse Readings from Last 1 Encounters:  01/06/22 86         Passed - Valid encounter within last 6 months    Recent Outpatient Visits          2 weeks ago Depression, major, recurrent, moderate (HCC)   [facility] 01/08/22, MD   2 months ago Annual physical exam   [facility], MD   8 months ago Essential hypertension   [facility], MD   10 months ago Essential hypertension   [facility], MD   1 year ago Essential hypertension   [facility], MD      Future Appointments            In 4 months Reubin Milan Judithann Graves, MD [facility]           . losartan (COZAAR) 25 MG tablet [Pharmacy Med Name: LOSARTAN 25MG  TABLETS] 90  tablet 1    Sig: TAKE 1 TABLET(25 MG) BY MOUTH DAILY     Cardiovascular:  Angiotensin Receptor Blockers Passed - 01/24/2022 12:14 PM      Passed - Cr in normal range and within 180 days    Creatinine, Ser  Date Value Ref Range Status  10/31/2021 0.86 0.44 - 1.00 mg/dL Final         Passed - K in normal range and within 180 days    Potassium  Date Value Ref Range Status  10/31/2021 3.5 3.5 - 5.1 mmol/L Final         Passed - Patient is not pregnant      Passed - Last BP in normal range    BP Readings from Last 1 Encounters:  01/06/22 122/84         Passed - Valid encounter within last 6 months    Recent Outpatient Visits          2 weeks ago Depression, major, recurrent, moderate (HCC)   [facility] 12/31/2021, MD   2 months ago Annual physical exam   [facility] Reubin Milan, MD   8 months ago Essential hypertension   [facility] Reubin Milan, MD   10 months ago Essential hypertension   [facility] Reubin Milan, MD   1 year ago Essential hypertension   [facility] Reubin Milan, MD      Future Appointments            In 4 months [facility] Reubin Milan, MD [facility]

## 2022-02-13 ENCOUNTER — Other Ambulatory Visit: Payer: Self-pay | Admitting: Internal Medicine

## 2022-02-13 DIAGNOSIS — E039 Hypothyroidism, unspecified: Secondary | ICD-10-CM

## 2022-02-13 NOTE — Telephone Encounter (Signed)
Rx 10/31/21 #90 1RF- 6 month supply Requested Prescriptions  Pending Prescriptions Disp Refills  . levothyroxine (SYNTHROID) 100 MCG tablet [Pharmacy Med Name: LEVOTHYROXINE 0.100MG  ( ) TAB] 90 tablet 1    Sig: TAKE 1 TABLET(100 MCG) BY MOUTH EVERY DAY 30 TO 60 MINUTES BEFORE BREAKFAST ON AN EMPTY STOMACH AND WITH A GLASS OF WATER     Endocrinology:  Hypothyroid Agents Passed - 02/13/2022  8:50 AM      Passed - TSH in normal range and within 360 days    TSH  Date Value Ref Range Status  10/31/2021 2.705 0.350 - 4.500 uIU/mL Final    Comment:    Performed by a 3rd Generation assay with a functional sensitivity of <=0.01 uIU/mL. Performed at [facility], [address] [place], [place] [address]          Passed - Valid encounter within last 12 months    Recent Outpatient Visits          1 month ago Depression, major, recurrent, moderate (HCC)   [place] [facility], Nyoka Cowden, MD   3 months ago Annual physical exam   [facility] at [facility], Nyoka Cowden, MD   8 months ago Essential hypertension   [place] [facility] at [facility], Nyoka Cowden, MD   11 months ago Essential hypertension   [place] [facility] at [facility], Nyoka Cowden, MD   1 year ago Essential hypertension   [place] [facility] at [facility], Nyoka Cowden, MD      Future Appointments            In 3 months Judithann Graves, Nyoka Cowden, MD [facility] at [facility], [facility]

## 2022-03-20 ENCOUNTER — Other Ambulatory Visit: Payer: Self-pay | Admitting: Internal Medicine

## 2022-03-20 DIAGNOSIS — J3089 Other allergic rhinitis: Secondary | ICD-10-CM

## 2022-03-20 NOTE — Telephone Encounter (Signed)
Requested Prescriptions  Pending Prescriptions Disp Refills  . fluticasone (FLONASE) 50 MCG/ACT nasal spray [Pharmacy Med Name: FLUTICASONE 50MCG NASAL SP (120) RX] 48 g 1    Sig: SHAKE LIQUID AND USE 2 SPRAYS IN EACH NOSTRIL DAILY     Ear, Nose, and Throat: Nasal Preparations - Corticosteroids Passed - 03/20/2022 10:02 AM      Passed - Valid encounter within last 12 months    Recent Outpatient Visits          2 months ago Depression, major, recurrent, moderate ([place])   [place] [facility], Jesse Sans, MD   4 months ago Annual physical exam   [place] [facility] at [facility], Jesse Sans, MD   9 months ago Essential hypertension   [place] [facility] at [facility], Jesse Sans, MD   1 year ago Essential hypertension   [place] [facility] at [facility], Jesse Sans, MD   1 year ago Essential hypertension   [place] [facility] at [facility], Jesse Sans, MD      Future Appointments            In 2 months Army Melia, Jesse Sans, MD [place] [facility] at [facility], [facility]

## 2022-04-22 ENCOUNTER — Other Ambulatory Visit: Payer: Self-pay | Admitting: Internal Medicine

## 2022-04-22 DIAGNOSIS — E039 Hypothyroidism, unspecified: Secondary | ICD-10-CM

## 2022-04-22 NOTE — Telephone Encounter (Signed)
Requested Prescriptions  Pending Prescriptions Disp Refills  . levothyroxine (SYNTHROID) 100 MCG tablet [Pharmacy Med Name: LEVOTHYROXINE 0.100MG  (100MCG) TAB] 90 tablet 1    Sig: TAKE 1 TABLET(100 MCG) BY MOUTH DAILY BEFORE BREAKFAST     Endocrinology:  Hypothyroid Agents Passed - 04/22/2022 12:08 PM      Passed - TSH in normal range and within 360 days    TSH  Date Value Ref Range Status  10/31/2021 2.705 0.350 - 4.500 uIU/mL Final    Comment:    Performed by a 3rd Generation assay with a functional sensitivity of <=0.01 uIU/mL. Performed at [facility], [address] [place], [place] [address]          Passed - Valid encounter within last 12 months    Recent Outpatient Visits          3 months ago Depression, major, recurrent, moderate ([place])   [place] [facility] at [facility], Jesse Sans, MD   5 months ago Annual physical exam   [facility] at [facility], Jesse Sans, MD   10 months ago Essential hypertension   [place] [facility] at [facility], Jesse Sans, MD   1 year ago Essential hypertension   [place] [facility] at [facility], Jesse Sans, MD   1 year ago Essential hypertension   [place] [facility] at [facility], Jesse Sans, MD      Future Appointments            In 1 month Army Melia, Jesse Sans, MD [place] [facility] at [facility], [facility]

## 2022-05-20 DIAGNOSIS — I10 Essential (primary) hypertension: Secondary | ICD-10-CM | POA: Diagnosis not present

## 2022-05-20 DIAGNOSIS — I358 Other nonrheumatic aortic valve disorders: Secondary | ICD-10-CM | POA: Diagnosis not present

## 2022-05-20 DIAGNOSIS — Z789 Other specified health status: Secondary | ICD-10-CM | POA: Diagnosis not present

## 2022-05-20 DIAGNOSIS — I2510 Atherosclerotic heart disease of native coronary artery without angina pectoris: Secondary | ICD-10-CM | POA: Diagnosis not present

## 2022-05-20 DIAGNOSIS — E782 Mixed hyperlipidemia: Secondary | ICD-10-CM | POA: Diagnosis not present

## 2022-05-20 DIAGNOSIS — I77810 Thoracic aortic ectasia: Secondary | ICD-10-CM | POA: Diagnosis not present

## 2022-05-20 DIAGNOSIS — I2584 Coronary atherosclerosis due to calcified coronary lesion: Secondary | ICD-10-CM | POA: Diagnosis not present

## 2022-06-02 DIAGNOSIS — Z0100 Encounter for examination of eyes and vision without abnormal findings: Secondary | ICD-10-CM | POA: Diagnosis not present

## 2022-06-02 DIAGNOSIS — M3501 Sicca syndrome with keratoconjunctivitis: Secondary | ICD-10-CM | POA: Diagnosis not present

## 2022-06-09 ENCOUNTER — Ambulatory Visit: Payer: Medicare PPO | Admitting: Internal Medicine

## 2022-07-07 ENCOUNTER — Ambulatory Visit: Payer: Medicare PPO | Admitting: Internal Medicine

## 2022-08-07 ENCOUNTER — Other Ambulatory Visit: Payer: Self-pay | Admitting: Internal Medicine

## 2022-08-07 DIAGNOSIS — F331 Major depressive disorder, recurrent, moderate: Secondary | ICD-10-CM

## 2022-08-08 ENCOUNTER — Other Ambulatory Visit: Payer: Self-pay | Admitting: Otolaryngology

## 2022-08-08 DIAGNOSIS — E041 Nontoxic single thyroid nodule: Secondary | ICD-10-CM

## 2022-08-25 ENCOUNTER — Ambulatory Visit: Payer: Medicare PPO | Admitting: Internal Medicine

## 2022-08-25 ENCOUNTER — Encounter: Payer: Self-pay | Admitting: Internal Medicine

## 2022-08-25 VITALS — BP 126/72 | HR 69 | Ht 66.0 in | Wt 268.0 lb

## 2022-08-25 DIAGNOSIS — E039 Hypothyroidism, unspecified: Secondary | ICD-10-CM | POA: Diagnosis not present

## 2022-08-25 DIAGNOSIS — K7469 Other cirrhosis of liver: Secondary | ICD-10-CM | POA: Diagnosis not present

## 2022-08-25 DIAGNOSIS — I10 Essential (primary) hypertension: Secondary | ICD-10-CM

## 2022-08-25 DIAGNOSIS — E782 Mixed hyperlipidemia: Secondary | ICD-10-CM | POA: Diagnosis not present

## 2022-08-25 DIAGNOSIS — E041 Nontoxic single thyroid nodule: Secondary | ICD-10-CM | POA: Diagnosis not present

## 2022-08-25 DIAGNOSIS — F331 Major depressive disorder, recurrent, moderate: Secondary | ICD-10-CM | POA: Diagnosis not present

## 2022-08-25 DIAGNOSIS — Z6841 Body Mass Index (BMI) 40.0 and over, adult: Secondary | ICD-10-CM

## 2022-08-25 DIAGNOSIS — I700 Atherosclerosis of aorta: Secondary | ICD-10-CM

## 2022-08-25 DIAGNOSIS — R7303 Prediabetes: Secondary | ICD-10-CM | POA: Diagnosis not present

## 2022-08-25 MED ORDER — SERTRALINE HCL 100 MG PO TABS: ORAL_TABLET | ORAL | 1 refills | Status: DC

## 2022-08-25 MED ORDER — BUPROPION HCL ER (XL) 300 MG PO TB24: ORAL_TABLET | ORAL | 1 refills | Status: AC

## 2022-08-25 MED ORDER — LOSARTAN POTASSIUM 25 MG PO TABS: 25.0000 mg | ORAL_TABLET | Freq: Every day | ORAL | 3 refills | Status: DC

## 2022-08-25 MED ORDER — LEVOTHYROXINE SODIUM 100 MCG PO TABS: ORAL_TABLET | ORAL | 1 refills | Status: DC

## 2022-08-25 NOTE — Assessment & Plan Note (Signed)
On diet control alone Lab Results  Component Value Date   HGBA1C 5.8 (H) 10/31/2021

## 2022-08-25 NOTE — Assessment & Plan Note (Addendum)
well controlled BP on losartan and amlodipine.  Losartan increased to 50 mg by cardiology but she had balance and dizziness issues so went back to 25 mg. Tolerating medications without side effects. Pt to continue current regimen and low sodium diet.

## 2022-08-25 NOTE — Assessment & Plan Note (Signed)
Clinically stable on current regimen with good control of symptoms, No SI or HI. Continue Zoloft, Buspar and Bupropion No change in management at this time.

## 2022-08-25 NOTE — Progress Notes (Signed)
Date:  08/25/2022   Name:  Rachel Dalton   DOB:  04-Jul-1945   MRN:  [id number]   Chief Complaint: Hypertension  Hypertension This is a chronic problem. The problem is resistant. Pertinent negatives include no chest pain, headaches, palpitations or shortness of breath. Past treatments include angiotensin blockers and calcium channel blockers (cardiology increase losartan to 50 mg in November). The current treatment provides significant improvement. There is no history of kidney disease, CAD/MI or CVA.  Depression        This is a chronic problem.The problem is unchanged.  Associated symptoms include no fatigue, no appetite change and no headaches.  Treatments tried: sertraline, buspar and buspirone.  Compliance with treatment is good. Diabetes She presents for her follow-up diabetic visit. Diabetes type: prediabetes. Pertinent negatives for hypoglycemia include no dizziness, headaches or tremors. Pertinent negatives for diabetes include no chest pain, no fatigue, no polydipsia, no polyuria and no weakness. Symptoms are stable. Pertinent negatives for diabetic complications include no CVA.    Lab Results  Component Value Date   NA 138 10/31/2021   K 3.5 10/31/2021   CO2 24 10/31/2021   GLUCOSE 122 (H) 10/31/2021   BUN 19 10/31/2021   CREATININE 0.86 10/31/2021   CALCIUM 9.7 10/31/2021   GFRNONAA >60 10/31/2021   Lab Results  Component Value Date   CHOL 186 10/31/2021   HDL 62 10/31/2021   LDLCALC 93 10/31/2021   TRIG 157 (H) 10/31/2021   CHOLHDL 3.0 10/31/2021   Lab Results  Component Value Date   TSH 2.705 10/31/2021   Lab Results  Component Value Date   HGBA1C 5.8 (H) 10/31/2021   Lab Results  Component Value Date   WBC 5.8 10/31/2021   HGB 14.4 10/31/2021   HCT 41.5 10/31/2021   MCV 88.1 10/31/2021   PLT 232 10/31/2021   Lab Results  Component Value Date   ALT 25 10/31/2021   AST 25 10/31/2021   ALKPHOS 74 10/31/2021   BILITOT 0.4 10/31/2021   Lab Results   Component Value Date   VD25OH 47.57 10/31/2021     Review of Systems  Constitutional:  Negative for appetite change, fatigue, fever and unexpected weight change.  HENT:  Negative for nosebleeds, tinnitus and trouble swallowing.   Eyes:  Negative for visual disturbance.  Respiratory:  Negative for cough, chest tightness, shortness of breath and wheezing.   Cardiovascular:  Negative for chest pain, palpitations and leg swelling.  Gastrointestinal:  Negative for abdominal pain, constipation and diarrhea.  Endocrine: Negative for polydipsia and polyuria.  Genitourinary:  Negative for dysuria and hematuria.  Musculoskeletal:  Positive for arthralgias and gait problem.  Neurological:  Positive for light-headedness (sometimes upon standing). Negative for dizziness, tremors, weakness, numbness and headaches.  Psychiatric/Behavioral:  Positive for depression. Negative for dysphoric mood.     Patient Active Problem List   Diagnosis Date Noted   Prediabetes 11/01/2021   Chronic neck pain 10/31/2021   Thyroid nodule 05/28/2021   Other cirrhosis of liver ([place]) 12/11/2020   Vitamin D deficiency 12/11/2020   GERD without esophagitis 12/11/2020   Acquired hypothyroidism 12/11/2020   Age-related osteoporosis without current pathological fracture 12/11/2020   BMI 40.0-44.9, adult ([place]) 12/11/2020   Aortic atherosclerosis ([place]) 12/10/2020   Myalgia due to statin 11/22/2019   Coronary artery calcification 04/06/2017   Depression, major, recurrent, moderate ([place]) 07/29/2016   Ascending aorta dilatation ([place]) 06/19/2015   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   Essential hypertension 01/18/2014    Allergies  Allergen Reactions   Topiramate Other (See Comments)    Balance and memory issues   Atorvastatin     Other reaction(s): Muscle Pain   Lisinopril Cough   Pravastatin     Other reaction(s): Muscle Pain   Rosuvastatin     Other reaction(s): Muscle Pain   Candesartan Other (See Comments)     dizziness   Tape Rash    Past Surgical History:  Procedure Laterality Date   ABDOMINAL HYSTERECTOMY     CHOLECYSTECTOMY     LUMBAR [place] SURGERY  2010    Social History   Tobacco Use   Smoking status: Never   Smokeless tobacco: Never  Vaping Use   Vaping Use: Never used  Substance Use Topics   Alcohol use: Yes    Comment: occasionally   Drug use: Never     Medication list has been reviewed and updated.  Current Meds  Medication Sig   acetaminophen (TYLENOL) 500 MG tablet Take by mouth.   amLODipine (NORVASC) 5 MG tablet TAKE 1 TABLET(5 MG) BY MOUTH DAILY   ascorbic acid (VITAMIN C) 1000 MG tablet Take by mouth.   aspirin 81 MG EC tablet Take by mouth.   busPIRone (BUSPAR) 7.5 MG tablet Take 1 tablet (7.5 mg total) by mouth 2 (two) times daily.   Cholecalciferol (VITAMIN D3 PO) Take by mouth daily.   Evolocumab (REPATHA SURECLICK) [id number] MG/ML SOAJ INJECT 1 ML UNDER THE SKIN EVERY 14 DAYS   ezetimibe (ZETIA) 10 MG tablet TAKE 1 TABLET(10 MG) BY MOUTH EVERY DAY   KRILL OIL PO Take by mouth.   Multiple Vitamin (MULTI-VITAMIN) tablet Take by mouth.   multivitamin-lutein (OCUVITE-LUTEIN) CAPS capsule Take 1 capsule by mouth daily.   omeprazole (PRILOSEC) 40 MG capsule Take 1 capsule (40 mg total) by mouth daily.   Tetrahydrozoline HCl (PX STERILE EYE DROPS OP) Apply to eye daily.   [DISCONTINUED] buPROPion (WELLBUTRIN XL) 300 MG 24 hr tablet TAKE 1 TABLET(300 MG) BY MOUTH DAILY   [DISCONTINUED] fluticasone (FLONASE) 50 MCG/ACT nasal spray SHAKE LIQUID AND USE 2 SPRAYS IN EACH NOSTRIL DAILY   [DISCONTINUED] levothyroxine (SYNTHROID) 100 MCG tablet TAKE 1 TABLET(100 MCG) BY MOUTH DAILY BEFORE BREAKFAST   [DISCONTINUED] Mometasone Furoate (PROPEL NA) Place into the nose as needed. Add to water   [DISCONTINUED] sertraline (ZOLOFT) 100 MG tablet TAKE 1 TABLET(100 MG) BY MOUTH DAILY       08/25/2022    1:24 PM 01/06/2022   11:50 AM 10/31/2021   11:32 AM 05/28/2021    1:47 PM  GAD  7 : Generalized Anxiety Score  Nervous, Anxious, on Edge '3 3 2 1  '$ Control/stop worrying '1 1 2 1  '$ Worry too much - different things 1 0 2 1  Trouble relaxing '1 2 2 1  '$ Restless '2 1 3 1  '$ Easily annoyed or irritable 1 1 0 1  Afraid - awful might happen 0 0 0 0  Total GAD 7 Score '9 8 11 6  '$ Anxiety Difficulty Very difficult Somewhat difficult  Not difficult at all       08/25/2022    1:24 PM 01/06/2022   11:49 AM 11/20/2021   12:49 PM  Depression screen PHQ 2/9  Decreased Interest '3 2 1  '$ Down, Depressed, Hopeless '1 3 1  '$ PHQ - 2 Score '4 5 2  '$ Altered sleeping 2 3 0  Tired, decreased energy '3 3 3  '$ Change in appetite 2 2 0  Feeling bad or failure about yourself  0 0 0  Trouble concentrating '2 2 2  '$ Moving slowly or fidgety/restless 2 0 2  Suicidal thoughts 2 0 0  PHQ-9 Score '17 15 9  '$ Difficult doing work/chores Very difficult Very difficult Not difficult at all    BP Readings from Last 3 Encounters:  08/25/22 126/72  01/06/22 122/84  10/31/21 120/82    Physical Exam Vitals and nursing note reviewed.  Constitutional:      General: She is not in acute distress.    Appearance: Normal appearance. She is well-developed. She is obese.  HENT:     Head: Normocephalic and atraumatic.  Cardiovascular:     Rate and Rhythm: Normal rate and regular rhythm.  Pulmonary:     Effort: Pulmonary effort is normal. No respiratory distress.     Breath sounds: No wheezing or rhonchi.  Musculoskeletal:     Cervical back: Normal range of motion.     Right lower leg: No edema.     Left lower leg: No edema.  Skin:    General: Skin is warm and dry.     Findings: No rash.  Neurological:     General: No focal deficit present.     Mental Status: She is alert and oriented to person, place, and time.  Psychiatric:        Mood and Affect: Mood normal.        Behavior: Behavior normal.     Wt Readings from Last 3 Encounters:  08/25/22 268 lb (121.6 kg)  01/06/22 261 lb (118.4 kg)  10/31/21 260 lb  (117.9 kg)    BP 126/72   Pulse 69   Ht '5\' 6"'$  (1.676 m)   Wt 268 lb (121.6 kg)   LMP  (LMP Unknown)   SpO2 98%   BMI 43.26 kg/m   Assessment and Plan: Problem List Items Addressed This Visit       Cardiovascular and Mediastinum   Aortic atherosclerosis (HCC) (Chronic)   Relevant Medications   losartan (COZAAR) 25 MG tablet   Essential hypertension - Primary (Chronic)     well controlled BP on losartan and amlodipine.  Losartan increased to 50 mg by cardiology but she had balance and dizziness issues so went back to 25 mg. Tolerating medications without side effects. Pt to continue current regimen and low sodium diet.       Relevant Medications   losartan (COZAAR) 25 MG tablet   Other Relevant Orders   CBC with Differential/Platelet   Comprehensive metabolic panel     Digestive   Other cirrhosis of liver (HCC) (Chronic)     Endocrine   Thyroid nodule    Bx last year benign      Relevant Medications   levothyroxine (SYNTHROID) 100 MCG tablet   Acquired hypothyroidism (Chronic)    Supplemented No recent issues or concerns      Relevant Medications   levothyroxine (SYNTHROID) 100 MCG tablet   Other Relevant Orders   TSH + free T4     Other   BMI 40.0-44.9, adult ([place])    Has gained 8 lbs since last visit Rec low carb diet with portion control Activity as tolerated      Combined hyperlipidemia    Intolerant of statins On Zetia and Repatha      Relevant Medications   losartan (COZAAR) 25 MG tablet   Other Relevant Orders   Lipid panel   Prediabetes    On diet control alone Lab Results  Component Value Date  HGBA1C 5.8 (H) 10/31/2021        Relevant Orders   Hemoglobin A1c   Depression, major, recurrent, moderate (HCC) (Chronic)    Clinically stable on current regimen with good control of symptoms, No SI or HI. Continue Zoloft, Buspar and Bupropion No change in management at this time.       Relevant Medications   sertraline (ZOLOFT)  100 MG tablet   buPROPion (WELLBUTRIN XL) 300 MG 24 hr tablet     Partially dictated using Editor, commissioning. Any errors are unintentional.  Halina Maidens, MD [facility]  08/25/2022

## 2022-08-25 NOTE — Assessment & Plan Note (Signed)
Has gained 8 lbs since last visit Rec low carb diet with portion control Activity as tolerated

## 2022-08-25 NOTE — Assessment & Plan Note (Signed)
Supplemented No recent issues or concerns

## 2022-08-25 NOTE — Assessment & Plan Note (Signed)
Intolerant of statins On Zetia and Repatha

## 2022-08-25 NOTE — Assessment & Plan Note (Signed)
Bx last year benign

## 2022-08-26 LAB — COMPREHENSIVE METABOLIC PANEL
ALT: 16 IU/L (ref 0–32)
AST: 16 IU/L (ref 0–40)
Albumin/Globulin Ratio: 2.1 (ref 1.2–2.2)
Albumin: 4.4 g/dL (ref 3.8–4.8)
Alkaline Phosphatase: 98 IU/L (ref 44–121)
BUN/Creatinine Ratio: 23 (ref 12–28)
BUN: 19 mg/dL (ref 8–27)
Bilirubin Total: 0.3 mg/dL (ref 0.0–1.2)
CO2: 25 mmol/L (ref 20–29)
Calcium: 10 mg/dL (ref 8.7–10.3)
Chloride: 102 mmol/L (ref 96–106)
Creatinine, Ser: 0.81 mg/dL (ref 0.57–1.00)
Globulin, Total: 2.1 g/dL (ref 1.5–4.5)
Glucose: 93 mg/dL (ref 70–99)
Potassium: 4.6 mmol/L (ref 3.5–5.2)
Sodium: 142 mmol/L (ref 134–144)
Total Protein: 6.5 g/dL (ref 6.0–8.5)
eGFR: 75 mL/min/{1.73_m2} (ref >59)

## 2022-08-26 LAB — LIPID PANEL
Chol/HDL Ratio: 2.3 ratio (ref 0.0–4.4)
Cholesterol, Total: 176 mg/dL (ref 100–199)
HDL: 75 mg/dL (ref >39)
LDL Chol Calc (NIH): 72 mg/dL (ref 0–99)
Triglycerides: 179 mg/dL — ABNORMAL HIGH (ref 0–149)
VLDL Cholesterol Cal: 29 mg/dL (ref 5–40)

## 2022-08-26 LAB — CBC WITH DIFFERENTIAL/PLATELET
Basophils Absolute: 0.1 10*3/uL (ref 0.0–0.2)
Basos: 1 %
EOS (ABSOLUTE): 0.1 10*3/uL (ref 0.0–0.4)
Eos: 1 %
Hematocrit: 43.3 % (ref 34.0–46.6)
Hemoglobin: 14.2 g/dL (ref 11.1–15.9)
Immature Grans (Abs): 0 10*3/uL (ref 0.0–0.1)
Immature Granulocytes: 0 %
Lymphocytes Absolute: 1.3 10*3/uL (ref 0.7–3.1)
Lymphs: 19 %
MCH: 29.8 pg (ref 26.6–33.0)
MCHC: 32.8 g/dL (ref 31.5–35.7)
MCV: 91 fL (ref 79–97)
Monocytes Absolute: 0.7 10*3/uL (ref 0.1–0.9)
Monocytes: 10 %
Neutrophils Absolute: 4.7 10*3/uL (ref 1.4–7.0)
Neutrophils: 69 %
Platelets: 238 10*3/uL (ref 150–450)
RBC: 4.76 x10E6/uL (ref 3.77–5.28)
RDW: 13.1 % (ref 11.7–15.4)
WBC: 6.9 10*3/uL (ref 3.4–10.8)

## 2022-08-26 LAB — HEMOGLOBIN A1C
Est. average glucose Bld gHb Est-mCnc: 123 mg/dL
Hgb A1c MFr Bld: 5.9 % — ABNORMAL HIGH (ref 4.8–5.6)

## 2022-08-26 LAB — TSH+FREE T4
Free T4: 1.47 ng/dL (ref 0.82–1.77)
TSH: 2.54 u[IU]/mL (ref 0.450–4.500)

## 2022-11-06 ENCOUNTER — Telehealth: Payer: Self-pay | Admitting: Internal Medicine

## 2022-11-06 NOTE — Telephone Encounter (Signed)
Copied from CRM [id number]. Topic: Medicare AWV >> Nov 06, 2022 10:28 AM Payton Doughty wrote: Reason for CRM: Called patient to schedule Medicare Annual Wellness Visit (AWV). No voicemail available to leave a message. MAILBOX FULL  Last date of AWV: 11/20/21  Please schedule an appointment at any time with Kennedy Bucker, LPN  .  If any questions, please contact me.  Thank you ,  Verlee Rossetti; Care Guide Ambulatory Clinical Support [facility] Direct Dial: [phone]

## 2022-12-13 ENCOUNTER — Other Ambulatory Visit: Payer: Self-pay | Admitting: Internal Medicine

## 2022-12-13 DIAGNOSIS — K219 Gastro-esophageal reflux disease without esophagitis: Secondary | ICD-10-CM

## 2023-01-08 ENCOUNTER — Other Ambulatory Visit: Payer: Self-pay | Admitting: Internal Medicine

## 2023-01-08 DIAGNOSIS — F331 Major depressive disorder, recurrent, moderate: Secondary | ICD-10-CM

## 2023-01-09 NOTE — Telephone Encounter (Signed)
Requested Prescriptions  Pending Prescriptions Disp Refills   busPIRone (BUSPAR) 7.5 MG tablet [Pharmacy Med Name: BUSPIRONE 7.5MG  TABLETS] 180 tablet 2    Sig: TAKE 1 TABLET(7.5 MG) BY MOUTH TWICE DAILY     Psychiatry: Anxiolytics/Hypnotics - Non-controlled Passed - 01/08/2023 10:51 AM      Passed - Valid encounter within last 12 months    Recent Outpatient Visits           4 months ago Essential hypertension   [place] [facility], Nyoka Cowden, MD   1 year ago Depression, major, recurrent, moderate (HCC)   [place] [facility] at [facility], Nyoka Cowden, MD   1 year ago Annual physical exam   [facility] at [facility], Nyoka Cowden, MD   1 year ago Essential hypertension   [place] [facility] at [facility], Nyoka Cowden, MD   1 year ago Essential hypertension   [place] [facility] at [facility], Nyoka Cowden, MD       Future Appointments             In 2 months Judithann Graves, Nyoka Cowden, MD [facility] [place] [facility] at [facility], [facility]

## 2023-02-17 ENCOUNTER — Ambulatory Visit (INDEPENDENT_AMBULATORY_CARE_PROVIDER_SITE_OTHER): Payer: Medicare PPO

## 2023-02-17 DIAGNOSIS — Z0000 Encounter for general adult medical examination without abnormal findings: Secondary | ICD-10-CM

## 2023-02-17 NOTE — Patient Instructions (Addendum)
Rachel Dalton , Thank you for taking time to come for your Medicare Wellness Visit. I appreciate your ongoing commitment to your health goals. Please review the following plan we discussed and let me know if I can assist you in the future.   Referrals/Orders/Follow-Ups/Clinician Recommendations: none  This is a list of the screening recommended for you and due dates:  Health Maintenance  Topic Date Due   COVID-19 Vaccine (3 - 2023-24 season) 02/21/2022   Flu Shot  01/22/2023   Medicare Annual Wellness Visit  02/17/2024   DTaP/Tdap/Td vaccine (3 - Td or Tdap) 06/02/2030   Pneumonia Vaccine  Completed   DEXA scan (bone density measurement)  Completed   Hepatitis C Screening  Completed   HPV Vaccine  Aged Out   Colon Cancer Screening  Discontinued   Zoster (Shingles) Vaccine  Discontinued    Advanced directives: (ACP Link)Information on Advanced Care Planning can be found at [facility] [place] Advance Health Care Directives Advance Health Care Directives ([email or web address]   Next Medicare Annual Wellness Visit scheduled for next year: Yes   02/23/24 @ 10:45 am by phone

## 2023-02-17 NOTE — Progress Notes (Signed)
Subjective:   Rachel Dalton is a 77 y.o. female who presents for Medicare Annual (Subsequent) preventive examination.  Visit Complete: Virtual  I connected with  Rachel Dalton on 02/17/23 by a audio enabled telemedicine application and verified that I am speaking with the correct person using two identifiers.  Patient Location: Home  Provider Location: Office/Clinic  I discussed the limitations of evaluation and management by telemedicine. The patient expressed understanding and agreed to proceed. Vital Signs: Unable to obtain new vitals due to this being a telehealth visit.  Review of Systems     Cardiac Risk Factors include: advanced age (>46men, >76 women);dyslipidemia;obesity (BMI >30kg/m2);hypertension;sedentary lifestyle     Objective:    There were no vitals filed for this visit. There is no height or weight on file to calculate BMI.     02/17/2023   11:35 AM 11/20/2021   12:53 PM  Advanced Directives  Does Patient Have a Medical Advance Directive? No No  Would patient like information on creating a medical advance directive? No - Patient declined No - Patient declined    Current Medications (verified) Outpatient Encounter Medications as of 02/17/2023  Medication Sig   acetaminophen (TYLENOL) 500 MG tablet Take by mouth.   amLODipine (NORVASC) 5 MG tablet TAKE 1 TABLET(5 MG) BY MOUTH DAILY   ascorbic acid (VITAMIN C) 1000 MG tablet Take by mouth.   aspirin 81 MG EC tablet Take by mouth.   buPROPion (WELLBUTRIN XL) 300 MG 24 hr tablet TAKE 1 TABLET(300 MG) BY MOUTH DAILY   busPIRone (BUSPAR) 7.5 MG tablet TAKE 1 TABLET(7.5 MG) BY MOUTH TWICE DAILY   Cholecalciferol (VITAMIN D3 PO) Take by mouth daily.   Evolocumab (REPATHA SURECLICK) 140 MG/ML SOAJ INJECT 1 ML UNDER THE SKIN EVERY 14 DAYS   ezetimibe (ZETIA) 10 MG tablet TAKE 1 TABLET(10 MG) BY MOUTH EVERY DAY   KRILL OIL PO Take by mouth.   levothyroxine (SYNTHROID) 100 MCG tablet TAKE 1 TABLET(100 MCG) BY MOUTH  DAILY BEFORE BREAKFAST   losartan (COZAAR) 25 MG tablet Take 1 tablet (25 mg total) by mouth daily.   Multiple Vitamin (MULTI-VITAMIN) tablet Take by mouth.   multivitamin-lutein (OCUVITE-LUTEIN) CAPS capsule Take 1 capsule by mouth daily.   omeprazole (PRILOSEC) 40 MG capsule TAKE 1 CAPSULE(40 MG) BY MOUTH DAILY   sertraline (ZOLOFT) 100 MG tablet TAKE 1 TABLET(100 MG) BY MOUTH DAILY   Tetrahydrozoline HCl (PX STERILE EYE DROPS OP) Apply to eye daily.   No facility-administered encounter medications on file as of 02/17/2023.    Allergies (verified) Topiramate, Atorvastatin, Lisinopril, Pravastatin, Rosuvastatin, Candesartan, and Tape   History: Past Medical History:  Diagnosis Date   Allergy    Ascending aorta dilatation (HCC)    Chronic hepatitis C with cirrhosis (HCC)    Coronary artery calcification    Depression    DOE (dyspnea on exertion)    Hyperlipidemia    Hypertension    Statin intolerance    Past Surgical History:  Procedure Laterality Date   ABDOMINAL HYSTERECTOMY     CHOLECYSTECTOMY     LUMBAR DISC SURGERY  2010   Family History  Problem Relation Age of Onset   Hypertension Mother    Heart disease Mother    Diabetes Mother    Hypertension Maternal Grandmother    Heart disease Maternal Grandmother    Social History   Socioeconomic History   Marital status: Widowed    Spouse name: Not on file   Number of children: 0  Years of education: Not on file   Highest education level: Not on file  Occupational History   Not on file  Tobacco Use   Smoking status: Never   Smokeless tobacco: Never  Vaping Use   Vaping status: Never Used  Substance and Sexual Activity   Alcohol use: Yes    Comment: occasionally   Drug use: Never   Sexual activity: Not Currently  Other Topics Concern   Not on file  Social History Narrative   Pt's sister in law lives with her   Social Determinants of Health   Financial Resource Strain: Low Risk  (02/17/2023)   Overall  Financial Resource Strain (CARDIA)    Difficulty of Paying Living Expenses: Not hard at all  Food Insecurity: No Food Insecurity (02/17/2023)   Hunger Vital Sign    Worried About Running Out of Food in the Last Year: Never true    Ran Out of Food in the Last Year: Never true  Transportation Needs: No Transportation Needs (02/17/2023)   PRAPARE - Administrator, Civil Service (Medical): No    Lack of Transportation (Non-Medical): No  Physical Activity: Inactive (02/17/2023)   Exercise Vital Sign    Days of Exercise per Week: 0 days    Minutes of Exercise per Session: 0 min  Stress: No Stress Concern Present (02/17/2023)   [employer] of Occupational Health - Occupational Stress Questionnaire    Feeling of Stress : Not at all  Social Connections: Unknown (02/17/2023)   Social Connection and Isolation Panel [NHANES]    Frequency of Communication with Friends and Family: Once a week    Frequency of Social Gatherings with Friends and Family: Not on file    Attends Religious Services: Never    Database administrator or Organizations: No    Attends Banker Meetings: Never    Marital Status: Widowed    Tobacco Counseling Counseling given: Not Answered   Clinical Intake:  Pre-visit preparation completed: Yes  Pain : No/denies pain     Nutritional Risks: None Diabetes: No  How often do you need to have someone help you when you read instructions, pamphlets, or other written materials from your doctor or pharmacy?: 1 - Never  Interpreter Needed?: No  Information entered by :: Kennedy Bucker, LPN   Activities of Daily Living    02/17/2023   11:36 AM  In your present state of health, do you have any difficulty performing the following activities:  Hearing? 1  Vision? 0  Difficulty concentrating or making decisions? 0  Walking or climbing stairs? 0  Dressing or bathing? 0  Doing errands, shopping? 0  Preparing Food and eating ? N  Using the  Toilet? N  In the past six months, have you accidently leaked urine? N  Do you have problems with loss of bowel control? N  Managing your Medications? N  Managing your Finances? N  Housekeeping or managing your Housekeeping? N    Patient Care Team: Reubin Milan, MD as PCP - General (Internal Medicine) Judi Saa, MD as Referring Physician (Cardiology)  Indicate any recent Medical Services you may have received from other than [facility] providers in the past year (date may be approximate).     Assessment:   This is a routine wellness examination for [facility].  Hearing/Vision screen Hearing Screening - Comments:: No aids Vision Screening - Comments:: No glasses-Dr.King  Dietary issues and exercise activities discussed:     Goals Addressed  This Visit's Progress    DIET - EAT MORE FRUITS AND VEGETABLES         Depression Screen    02/17/2023   11:33 AM 08/25/2022    1:24 PM 01/06/2022   11:49 AM 11/20/2021   12:49 PM 10/31/2021   11:32 AM 05/28/2021    1:47 PM 03/15/2021    2:50 PM  PHQ 2/9 Scores  PHQ - 2 Score 0 4 5 2 6 4 6   PHQ- 9 Score 0 17 15 9 24 17 15     Fall Risk    02/17/2023   11:35 AM 08/25/2022    1:24 PM 11/20/2021   12:55 PM 10/31/2021   11:32 AM 05/28/2021    1:47 PM  Fall Risk   Falls in the past year? 0 1 1 1 1   Number falls in past yr: 0 1 0 0 1  Injury with Fall? 0 0 0 0 0  Risk for fall due to : No Fall Risks History of fall(s) History of fall(s) History of fall(s) History of fall(s);Impaired balance/gait  Follow up Falls prevention discussed;Falls evaluation completed Falls evaluation completed Falls prevention discussed Falls evaluation completed Falls evaluation completed    MEDICARE RISK AT HOME: Medicare Risk at Home Any stairs in or around the home?: No If so, are there any without handrails?: No Home free of loose throw rugs in walkways, pet beds, electrical cords, etc?: Yes Adequate lighting in your home to reduce  risk of falls?: Yes Life alert?: No Use of a cane, walker or w/c?: No Grab bars in the bathroom?: Yes Shower chair or bench in shower?: Yes Elevated toilet seat or a handicapped toilet?: No  TIMED UP AND GO:  Was the test performed?  No    Cognitive Function:        02/17/2023   11:37 AM  6CIT Screen  What Year? 0 points  What month? 0 points  What time? 0 points  Count back from 20 0 points  Months in reverse 0 points  Repeat phrase 0 points  Total Score 0 points    Immunizations Immunization History  Administered Date(s) Administered   Hep A / Hep B 06/09/2014, 07/11/2014   Hepatitis B, ADULT 11/24/2014   Influenza, High Dose Seasonal PF 03/09/2019, 04/16/2020   PFIZER(Purple Top)SARS-COV-2 Vaccination 11/23/2019, 12/14/2019   Pneumococcal Conjugate-13 02/19/2015   Pneumococcal Polysaccharide-23 03/23/2013   Tdap 01/18/2014, 06/02/2020   Zoster, Live 03/30/2015    TDAP status: Up to date  Flu Vaccine status: Declined, Education has been provided regarding the importance of this vaccine but patient still declined. Advised may receive this vaccine at local pharmacy or Health Dept. Aware to provide a copy of the vaccination record if obtained from local pharmacy or Health Dept. Verbalized acceptance and understanding.  Pneumococcal vaccine status: Up to date  Covid-19 vaccine status: Completed vaccines  Qualifies for Shingles Vaccine? Yes   Zostavax completed Yes   Shingrix Completed?: No.    Education has been provided regarding the importance of this vaccine. Patient has been advised to call insurance company to determine out of pocket expense if they have not yet received this vaccine. Advised may also receive vaccine at local pharmacy or Health Dept. Verbalized acceptance and understanding.  Screening Tests Health Maintenance  Topic Date Due   COVID-19 Vaccine (3 - 2023-24 season) 02/21/2022   INFLUENZA VACCINE  01/22/2023   Medicare Annual Wellness (AWV)   02/17/2024   DTaP/Tdap/Td (3 - Td or Tdap) 06/02/2030  Pneumonia Vaccine 30+ Years old  Completed   DEXA SCAN  Completed   Hepatitis C Screening  Completed   HPV VACCINES  Aged Out   Colonoscopy  Discontinued   Zoster Vaccines- Shingrix  Discontinued    Health Maintenance  Health Maintenance Due  Topic Date Due   COVID-19 Vaccine (3 - 2023-24 season) 02/21/2022   INFLUENZA VACCINE  01/22/2023    Colorectal cancer screening: No longer required.   Mammogram status: No longer required due to age.  Bone Density status: Completed 03/30/18. Results reflect: Bone density results: OSTEOPOROSIS. Repeat every 2 years.-DECLINED REFERRAL FOR BDS  Lung Cancer Screening: (Low Dose CT Chest recommended if Age 76-80 years, 20 pack-year currently smoking OR have quit w/in 15years.) does not qualify.   Additional Screening:  Hepatitis C Screening: does qualify; Completed 12/11/20  Vision Screening: Recommended annual ophthalmology exams for early detection of glaucoma and other disorders of the eye. Is the patient up to date with their annual eye exam?  Yes  Who is the provider or what is the name of the office in which the patient attends annual eye exams? Dr.King If pt is not established with a provider, would they like to be referred to a provider to establish care? No .   Dental Screening: Recommended annual dental exams for proper oral hygiene   Community Resource Referral / Chronic Care Management: CRR required this visit?  No   CCM required this visit?  No     Plan:     I have personally reviewed and noted the following in the patient's chart:   Medical and social history Use of alcohol, tobacco or illicit drugs  Current medications and supplements including opioid prescriptions. Patient is not currently taking opioid prescriptions. Functional ability and status Nutritional status Physical activity Advanced directives List of other physicians Hospitalizations, surgeries,  and ER visits in previous 12 months Vitals Screenings to include cognitive, depression, and falls Referrals and appointments  In addition, I have reviewed and discussed with patient certain preventive protocols, quality metrics, and best practice recommendations. A written personalized care plan for preventive services as well as general preventive health recommendations were provided to patient.     Hal Hope, LPN   [id number]   After Visit Summary: (MyChart) Due to this being a telephonic visit, the after visit summary with patients personalized plan was offered to patient via MyChart   Nurse Notes: none

## 2023-03-20 ENCOUNTER — Other Ambulatory Visit: Payer: Self-pay | Admitting: Internal Medicine

## 2023-03-20 DIAGNOSIS — K219 Gastro-esophageal reflux disease without esophagitis: Secondary | ICD-10-CM

## 2023-03-26 ENCOUNTER — Encounter: Payer: Medicare PPO | Admitting: Internal Medicine

## 2023-05-09 ENCOUNTER — Other Ambulatory Visit: Payer: Self-pay | Admitting: Internal Medicine

## 2023-05-09 DIAGNOSIS — F331 Major depressive disorder, recurrent, moderate: Secondary | ICD-10-CM

## 2023-05-09 DIAGNOSIS — E039 Hypothyroidism, unspecified: Secondary | ICD-10-CM

## 2023-05-11 NOTE — Telephone Encounter (Signed)
Requested by interface surescripts. No future visit at this time.  Requested Prescriptions  Pending Prescriptions Disp Refills   levothyroxine (SYNTHROID) 100 MCG tablet [Pharmacy Med Name: LEVOTHYROXINE 0.100MG  ( ) TAB] 90 tablet 1    Sig: TAKE 1 TABLET(100 MCG) BY MOUTH DAILY BEFORE BREAKFAST     Endocrinology:  Hypothyroid Agents Passed - 05/09/2023 11:35 AM      Passed - TSH in normal range and within 360 days    TSH  Date Value Ref Range Status  08/25/2022 2.540 0.450 - 4.500 uIU/mL Final         Passed - Valid encounter within last 12 months    Recent Outpatient Visits           8 months ago Essential hypertension   [place] [facility], Nyoka Cowden, MD   1 year ago Depression, major, recurrent, moderate (HCC)   [place] [facility], Nyoka Cowden, MD   1 year ago Annual physical exam   [facility] at [facility], Nyoka Cowden, MD   1 year ago Essential hypertension   [place] [facility] at [facility], Nyoka Cowden, MD   2 years ago Essential hypertension   [place] [facility] at [facility], Nyoka Cowden, MD               sertraline (ZOLOFT) 100 MG tablet [Pharmacy Med Name: SERTRALINE 100MG  TABLETS] 90 tablet 1    Sig: TAKE 1 TABLET(100 MG) BY MOUTH DAILY     Psychiatry:  Antidepressants - SSRI - sertraline Passed - 05/09/2023 11:35 AM      Passed - AST in normal range and within 360 days    AST  Date Value Ref Range Status  08/25/2022 16 0 - 40 IU/L Final         Passed - ALT in normal range and within 360 days    ALT  Date Value Ref Range Status  08/25/2022 16 0 - 32 IU/L Final         Passed - Completed PHQ-2 or PHQ-9 in the last 360 days      Passed - Valid encounter within last 6 months    Recent Outpatient Visits           8 months ago Essential  hypertension   [place] [facility] at [facility], Nyoka Cowden, MD   1 year ago Depression, major, recurrent, moderate (HCC)   [place] [facility] at [facility], Nyoka Cowden, MD   1 year ago Annual physical exam   [facility] at [facility], Nyoka Cowden, MD   1 year ago Essential hypertension   [place] [facility] at [facility], Nyoka Cowden, MD   2 years ago Essential hypertension   [facility] at [facility], Nyoka Cowden, MD

## 2023-06-04 DIAGNOSIS — M810 Age-related osteoporosis without current pathological fracture: Secondary | ICD-10-CM | POA: Diagnosis not present

## 2023-06-04 DIAGNOSIS — I700 Atherosclerosis of aorta: Secondary | ICD-10-CM | POA: Diagnosis not present

## 2023-06-04 DIAGNOSIS — Z6841 Body Mass Index (BMI) 40.0 and over, adult: Secondary | ICD-10-CM | POA: Diagnosis not present

## 2023-06-04 DIAGNOSIS — E041 Nontoxic single thyroid nodule: Secondary | ICD-10-CM | POA: Diagnosis not present

## 2023-06-04 DIAGNOSIS — I77810 Thoracic aortic ectasia: Secondary | ICD-10-CM | POA: Diagnosis not present

## 2023-06-04 DIAGNOSIS — Z780 Asymptomatic menopausal state: Secondary | ICD-10-CM | POA: Diagnosis not present

## 2023-06-04 DIAGNOSIS — Z8639 Personal history of other endocrine, nutritional and metabolic disease: Secondary | ICD-10-CM | POA: Diagnosis not present

## 2023-06-04 DIAGNOSIS — I2510 Atherosclerotic heart disease of native coronary artery without angina pectoris: Secondary | ICD-10-CM | POA: Diagnosis not present

## 2023-06-04 DIAGNOSIS — Z8619 Personal history of other infectious and parasitic diseases: Secondary | ICD-10-CM | POA: Diagnosis not present

## 2023-07-24 DIAGNOSIS — E039 Hypothyroidism, unspecified: Secondary | ICD-10-CM | POA: Diagnosis not present

## 2023-07-24 DIAGNOSIS — B182 Chronic viral hepatitis C: Secondary | ICD-10-CM | POA: Diagnosis not present

## 2023-07-24 DIAGNOSIS — E782 Mixed hyperlipidemia: Secondary | ICD-10-CM | POA: Diagnosis not present

## 2023-07-24 DIAGNOSIS — R5383 Other fatigue: Secondary | ICD-10-CM | POA: Diagnosis not present

## 2023-07-24 DIAGNOSIS — Z789 Other specified health status: Secondary | ICD-10-CM | POA: Diagnosis not present

## 2023-07-24 DIAGNOSIS — I700 Atherosclerosis of aorta: Secondary | ICD-10-CM | POA: Diagnosis not present

## 2023-07-24 DIAGNOSIS — I10 Essential (primary) hypertension: Secondary | ICD-10-CM | POA: Diagnosis not present

## 2023-07-24 DIAGNOSIS — E041 Nontoxic single thyroid nodule: Secondary | ICD-10-CM | POA: Diagnosis not present

## 2023-07-24 DIAGNOSIS — E559 Vitamin D deficiency, unspecified: Secondary | ICD-10-CM | POA: Diagnosis not present

## 2023-07-24 DIAGNOSIS — R7303 Prediabetes: Secondary | ICD-10-CM | POA: Diagnosis not present

## 2023-08-10 ENCOUNTER — Other Ambulatory Visit: Payer: Self-pay | Admitting: Internal Medicine

## 2023-08-10 DIAGNOSIS — E039 Hypothyroidism, unspecified: Secondary | ICD-10-CM

## 2023-08-11 NOTE — Telephone Encounter (Signed)
 Requested Prescriptions  Pending Prescriptions Disp Refills   levothyroxine (SYNTHROID) 100 MCG tablet [Pharmacy Med Name: LEVOTHYROXINE 0.100MG  ( ) TAB] 90 tablet 0    Sig: TAKE 1 TABLET(100 MCG) BY MOUTH DAILY BEFORE BREAKFAST     Endocrinology:  Hypothyroid Agents Passed - 08/11/2023 10:32 AM      Passed - TSH in normal range and within 360 days    TSH  Date Value Ref Range Status  08/25/2022 2.540 0.450 - 4.500 uIU/mL Final         Passed - Valid encounter within last 12 months    Recent Outpatient Visits           11 months ago Essential hypertension   [place] [facility] at [facility], Nyoka Cowden, MD   1 year ago Depression, major, recurrent, moderate (HCC)   [place] [facility] at [facility], Nyoka Cowden, MD   1 year ago Annual physical exam   [facility] at [facility], Nyoka Cowden, MD   2 years ago Essential hypertension   [place] [facility] at [facility], Nyoka Cowden, MD   2 years ago Essential hypertension   [facility] at [facility], Nyoka Cowden, MD

## 2023-10-08 IMAGING — US US THYROID
1 series · 13 of 25 positions shown · non-contrast
Comparison: None.

CLINICAL DATA: Other. 75-year-old female with history of thyroid
nodule.

EXAM:
THYROID ULTRASOUND
TECHNIQUE: Ultrasound examination of the thyroid gland and adjacent soft
tissues was performed.

[Series 1: us thyroid · 0.07mm/px · 13 of 44 slices shown]
[im 1/44]
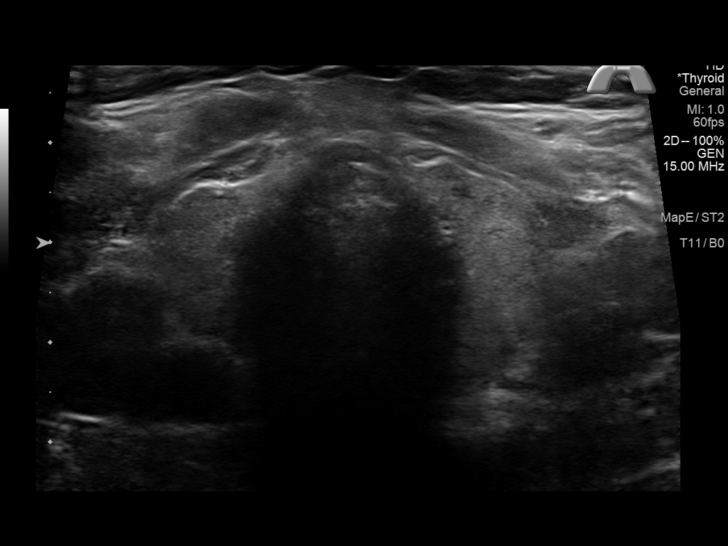
[im 4/44]
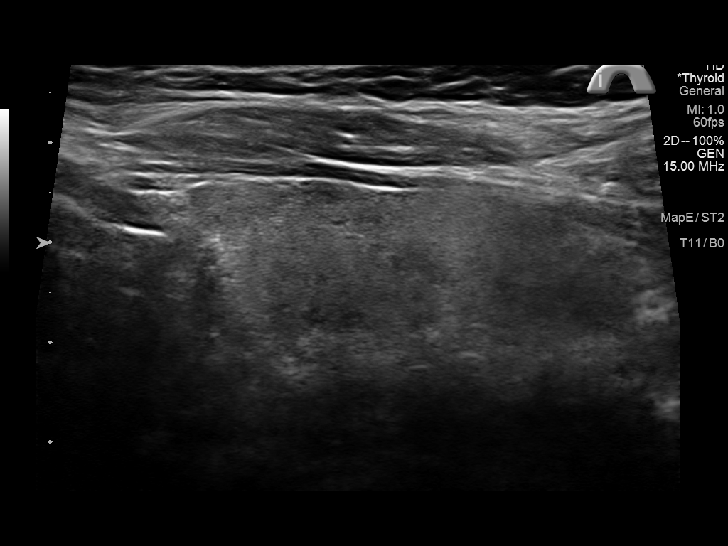
[im 8/44]
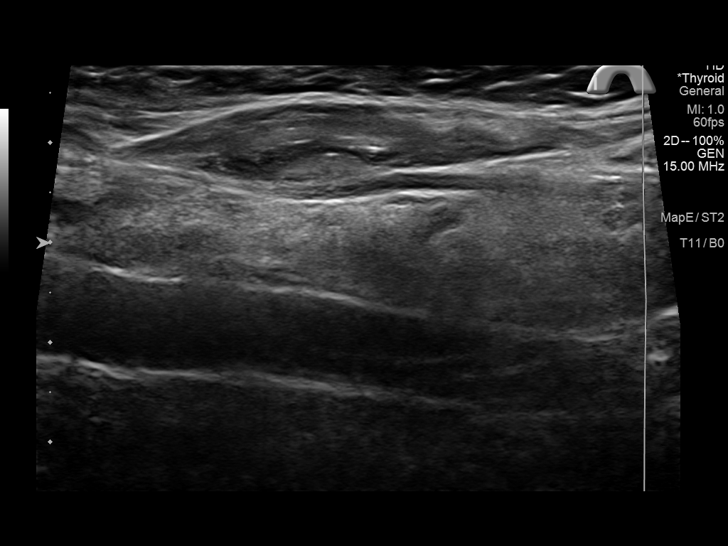
[im 11/44]
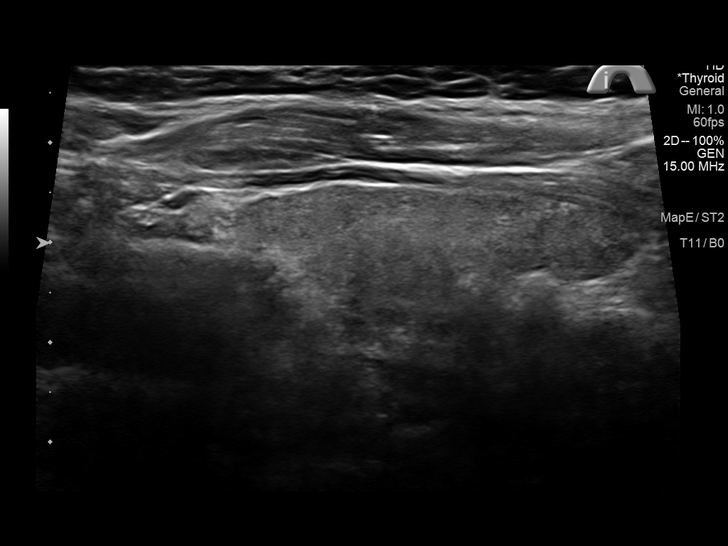
[im 15/44]
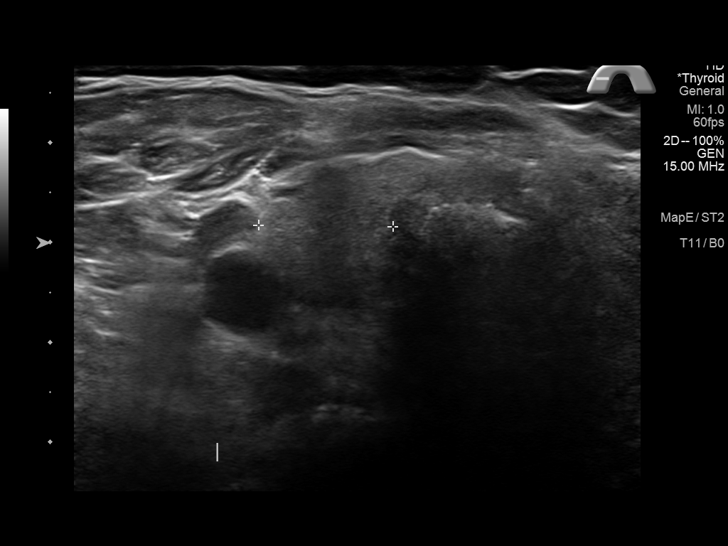
[im 18/44]
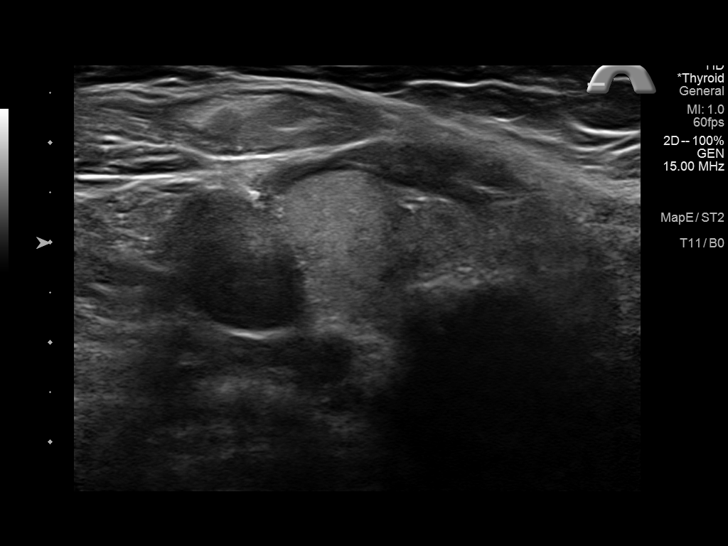
[im 22/44]
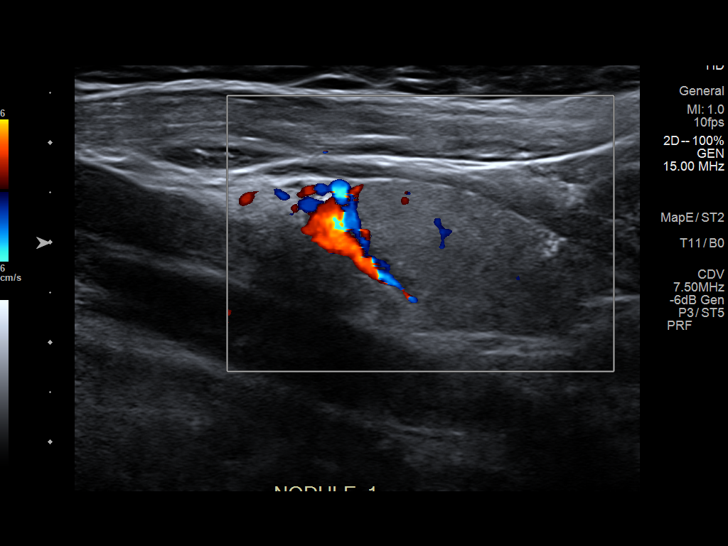
[im 26/44]
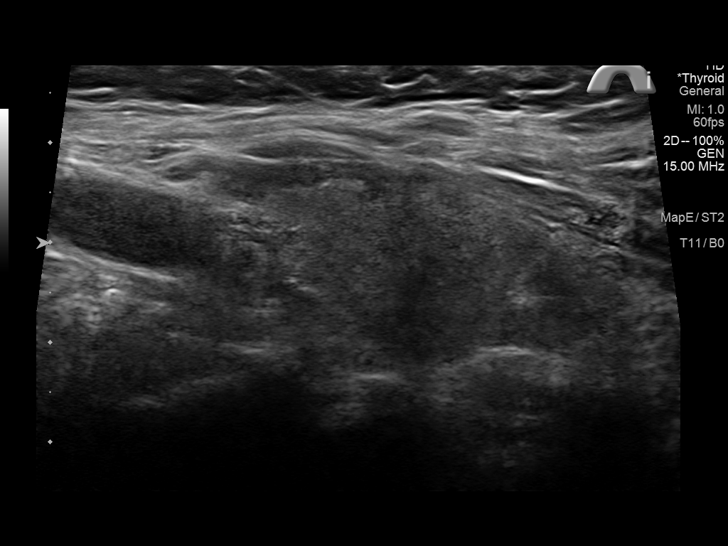
[im 29/44]
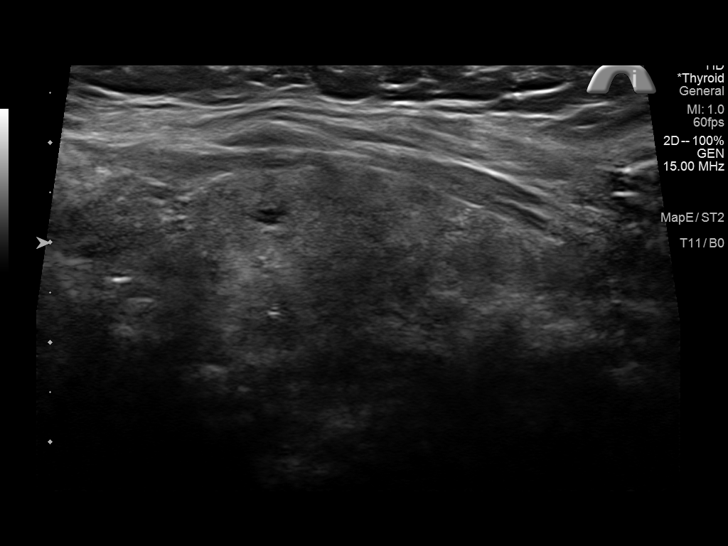
[im 33/44]
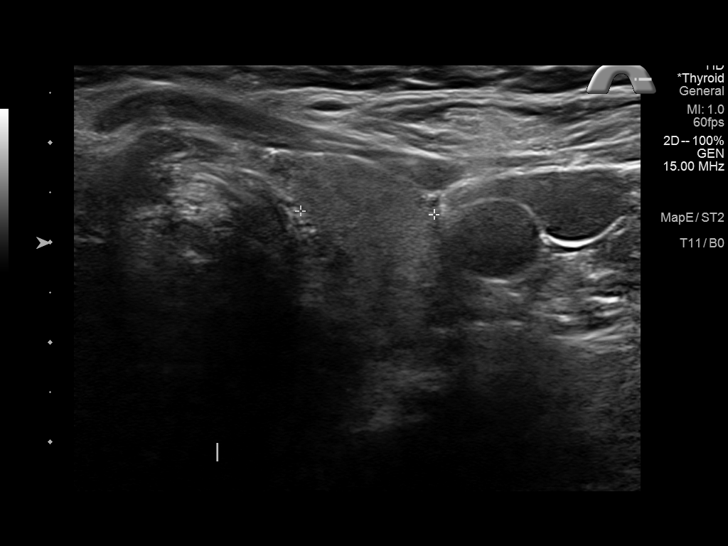
[im 36/44]
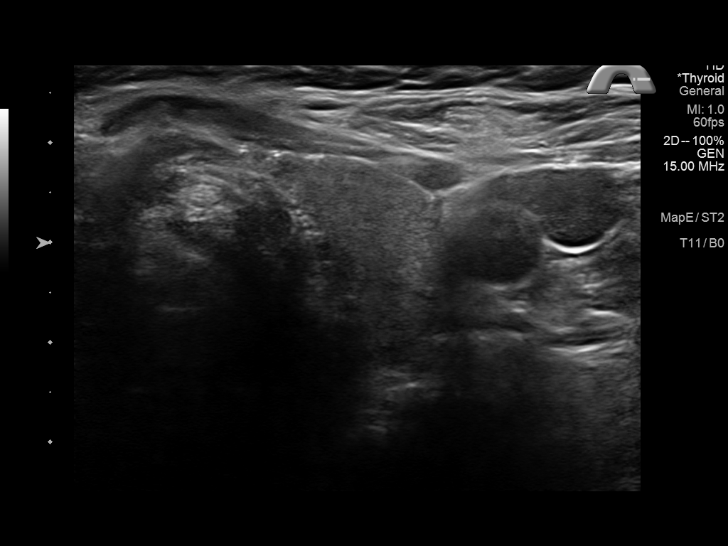
[im 40/44]
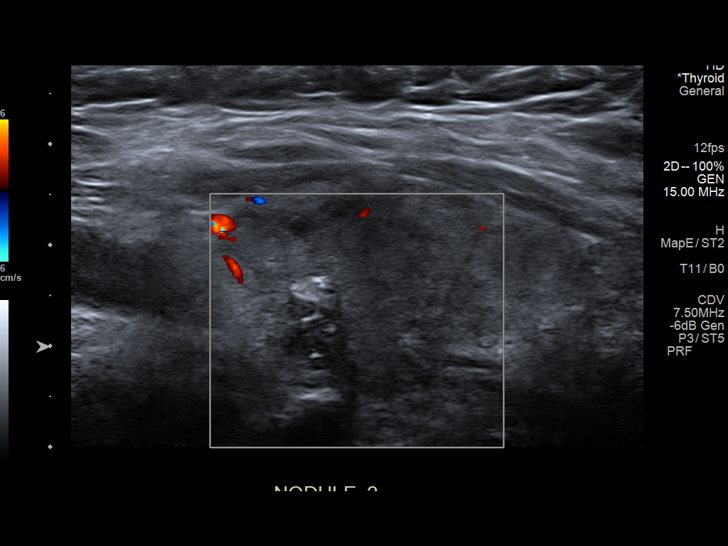
[im 44/44]
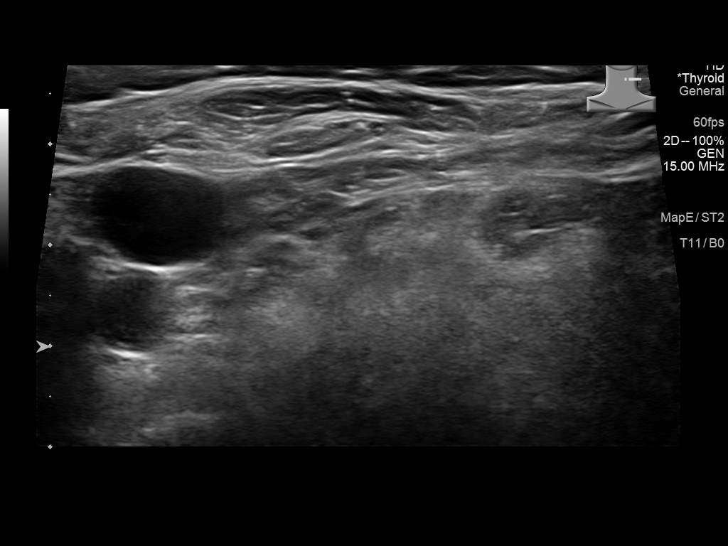

[13 of 25 positions shown; findings below may reference images not displayed]

FINDINGS: Parenchymal Echotexture: Mildly heterogenous

Isthmus: 0.3 cm

Right lobe: 4.6 x 1.4 x 1.4 cm

Left lobe: 4.6 x 1.8 x 1.3 cm

_________________________________________________________

Estimated total number of nodules >/= 1 cm: 1

Number of spongiform nodules >/=  2 cm not described below (TR1): 0

Number of mixed cystic and solid nodules >/= 1.5 cm not described
below (TR2): 0

_________________________________________________________

Nodule # 1:

Location: Right; Inferior

Maximum size: 2.5 cm; Other 2 dimensions: 1.3 x 1.1 cm

Composition: solid/almost completely solid (2)

Echogenicity: isoechoic (1)

Shape: not taller-than-wide (0)

Margins: smooth (0)

Echogenic foci: none (0)

ACR TI-RADS total points: 3.

ACR TI-RADS risk category: TR3 (3 points).

ACR TI-RADS recommendations:

**Given size (>/= 2.5 cm) and appearance, fine needle aspiration of
this mildly suspicious nodule should be considered based on TI-RADS
criteria.

_________________________________________________________

Nodule # 2:

Location: Left; Mid

Maximum size: 0.9 cm; Other 2 dimensions: 0.8 x 0.7 cm

Composition: cannot determine (2)

Echogenicity: cannot determine (1)

Shape: not taller-than-wide (0)

Margins: ill-defined (0)

Echogenic foci: macrocalcifications (1)

ACR TI-RADS total points: 4.

ACR TI-RADS risk category: TR4 (4-6 points).

ACR TI-RADS recommendations:

Given size (<1.0 cm) and appearance, this nodule does NOT meet
TI-RADS criteria for biopsy or dedicated follow-up.

_________________________________________________________

No cervical lymphadenopathy.
IMPRESSION: 1. Right inferior solid thyroid nodule (labeled 1, 2.5 cm) meets
criteria (TI-RADS category 3) for tissue sampling. Recommend
ultrasound-guided fine-needle aspiration if not already performed.
2. Indeterminate nodule in the left mid thyroid (labeled 2, 0.9 cm)
does not meet criteria (TI-RADS category 4) for dedicated ultrasound
follow-up or tissue sampling.

The above is in keeping with the ACR TI-RADS recommendations - [HOSPITAL] 3428;[DATE].

## 2023-10-10 ENCOUNTER — Other Ambulatory Visit: Payer: Self-pay | Admitting: Internal Medicine

## 2023-10-10 DIAGNOSIS — F331 Major depressive disorder, recurrent, moderate: Secondary | ICD-10-CM

## 2023-10-12 NOTE — Telephone Encounter (Signed)
 Requested Prescriptions  Refused Prescriptions Disp Refills   busPIRone  (BUSPAR ) 7.5 MG tablet [Pharmacy Med Name: BUSPIRONE  7.5MG  TABLETS] 180 tablet 2    Sig: TAKE 1 TABLET(7.5 MG) BY MOUTH TWICE DAILY     Psychiatry: Anxiolytics/Hypnotics - Non-controlled Failed - 10/12/2023  1:47 PM      Failed - Valid encounter within last 12 months    Recent Outpatient Visits   None             buPROPion  (WELLBUTRIN  XL) 300 MG 24 hr tablet [Pharmacy Med Name: BUPROPION  XL 300MG  TABLETS] 90 tablet 1    Sig: TAKE 1 TABLET(300 MG) BY MOUTH DAILY     Psychiatry: Antidepressants - bupropion  Failed - 10/12/2023  1:47 PM      Failed - Cr in normal range and within 360 days    Creatinine, Ser  Date Value Ref Range Status  08/25/2022 0.81 0.57 - 1.00 mg/dL Final         Failed - AST in normal range and within 360 days    AST  Date Value Ref Range Status  08/25/2022 16 0 - 40 IU/L Final         Failed - ALT in normal range and within 360 days    ALT  Date Value Ref Range Status  08/25/2022 16 0 - 32 IU/L Final         Failed - Valid encounter within last 6 months    Recent Outpatient Visits   None            Passed - Completed PHQ-2 or PHQ-9 in the last 360 days      Passed - Last BP in normal range    BP Readings from Last 1 Encounters:  08/25/22 126/72

## 2023-11-20 IMAGING — US US FNA BIOPSY THYROID 1ST LESION
1 series · 13 of 16 positions shown · non-contrast
Comparison: Ultrasound thyroid June 03, 2021

MEDICATIONS:
Local 1% lidocaine only.

COMPLICATIONS:
None immediate.

INDICATION: Indeterminate thyroid nodule

EXAM:
ULTRASOUND GUIDED FINE NEEDLE ASPIRATION OF INDETERMINATE THYROID
NODULE
TECHNIQUE: Informed written consent was obtained from the patient after a
discussion of the risks, benefits and alternatives to treatment.
Questions regarding the procedure were encouraged and answered. A
timeout was performed prior to the initiation of the procedure.

[Series 1: us fna biopsy thyroid 1st lesion · 0.06mm/px · 16 acquisitions, 13 frames shown]
[im 1/16]
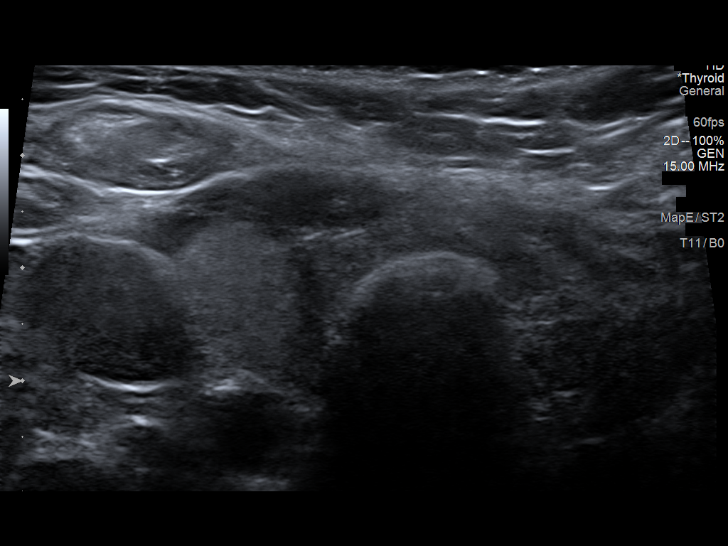
[im 2/16]
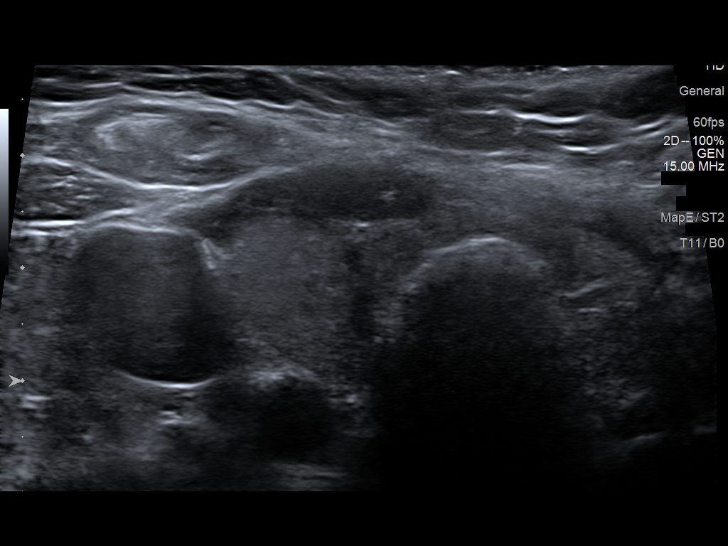
[im 4/16]
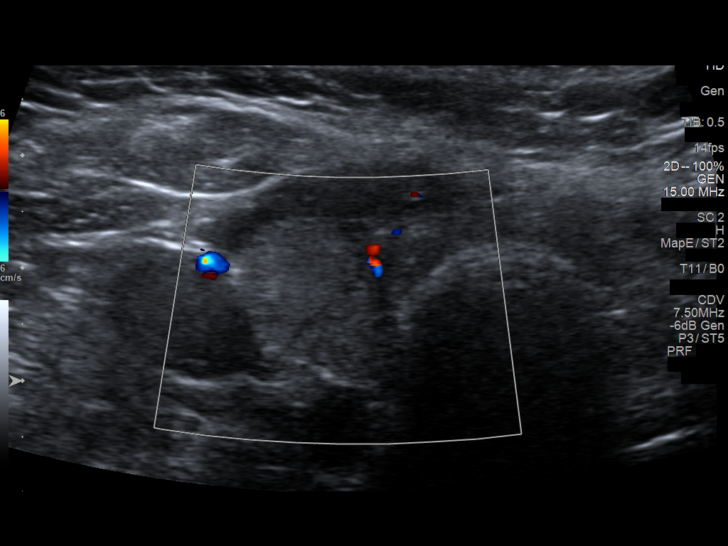
[im 5/16]
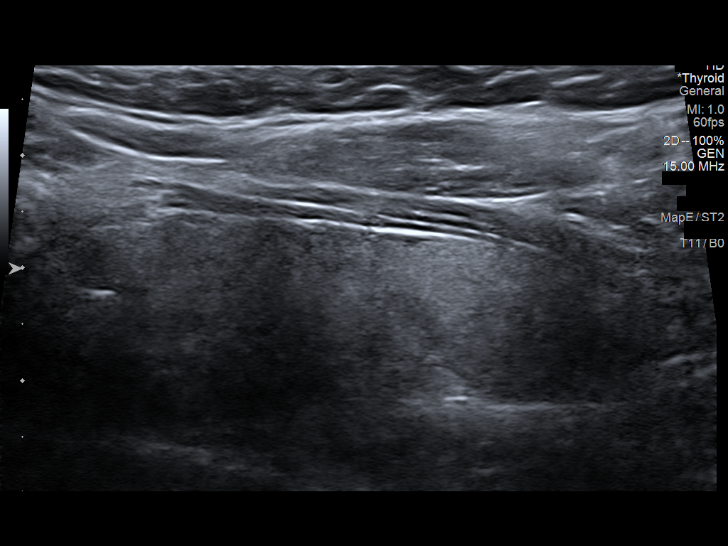
[im 6/16]
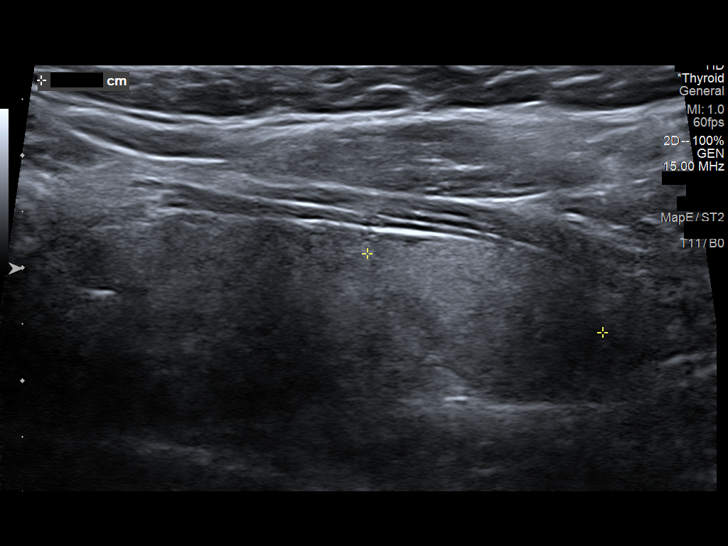
[im 7/16]
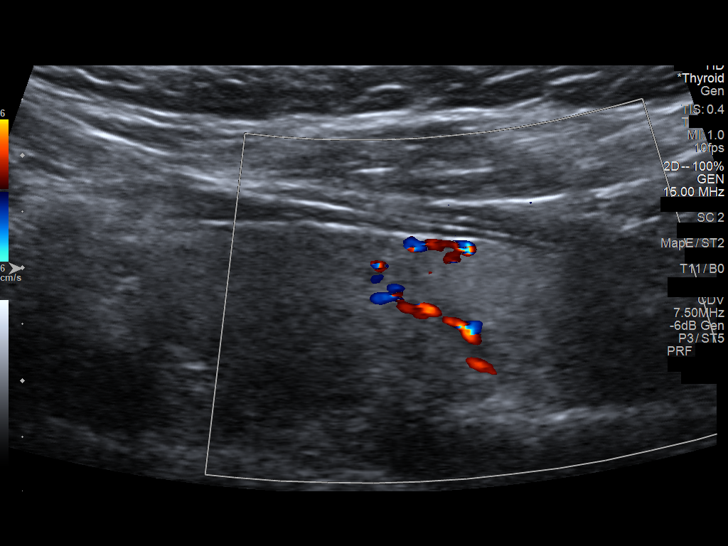
[im 9/16]
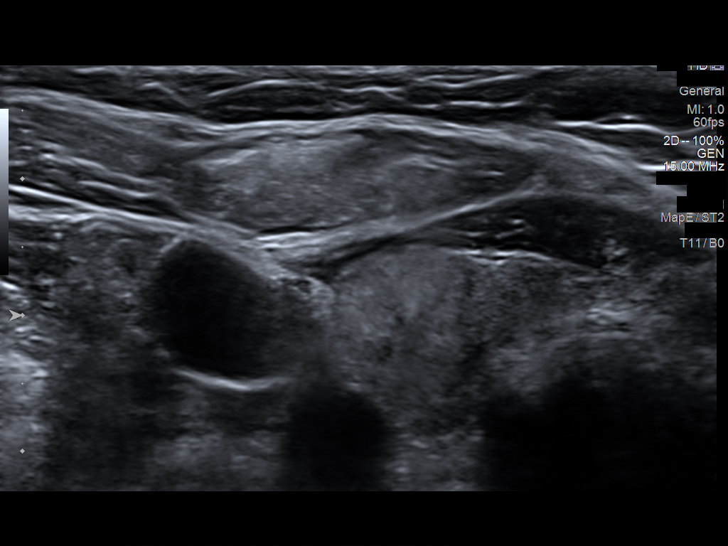
[im 10/16]
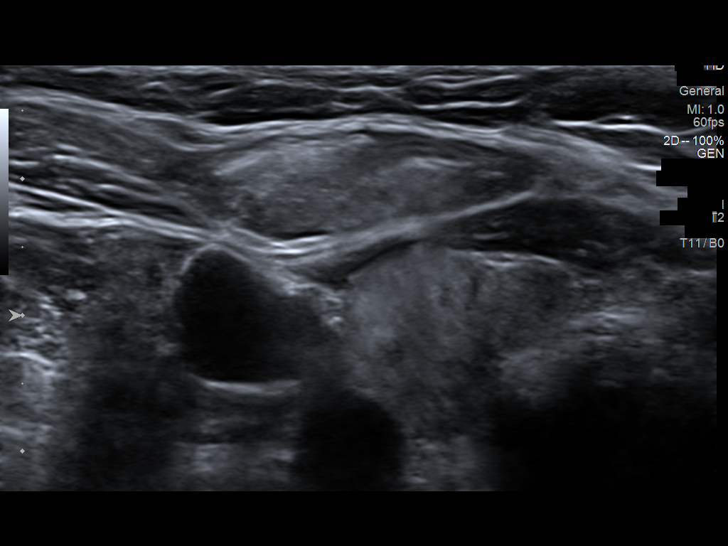
[im 11/16]
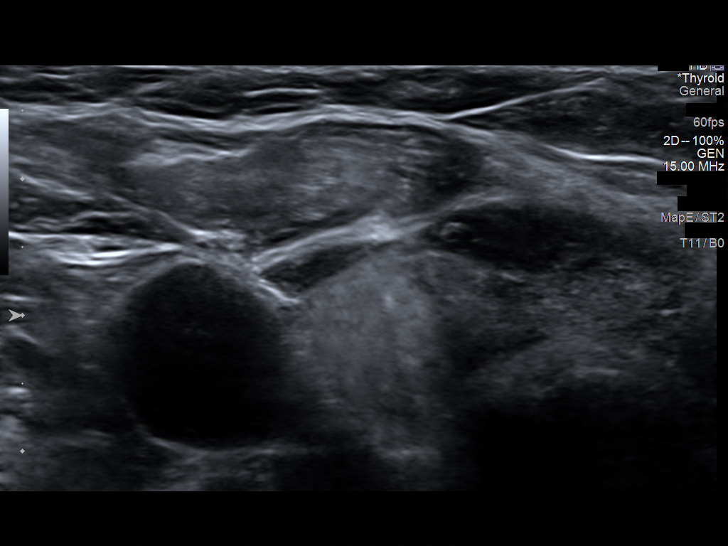
[im 12/16]
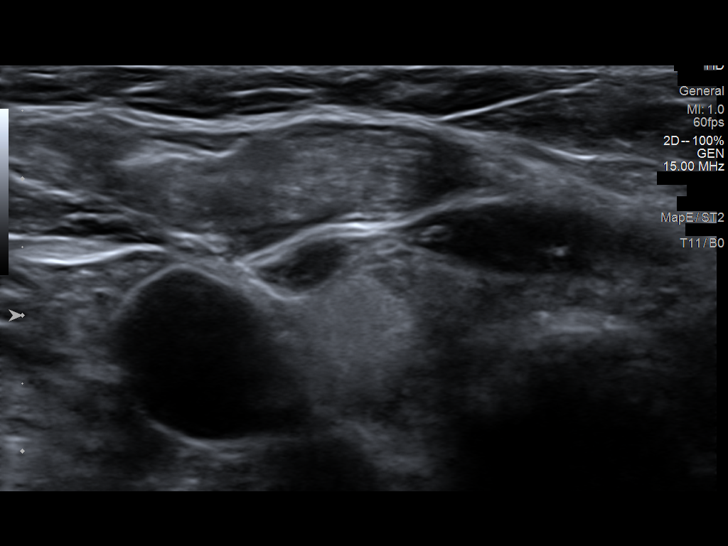
[im 13/16]
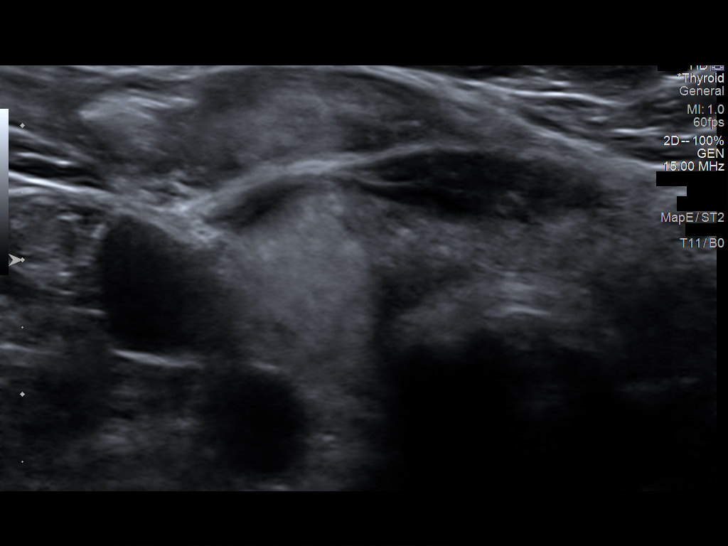
[im 15/16]
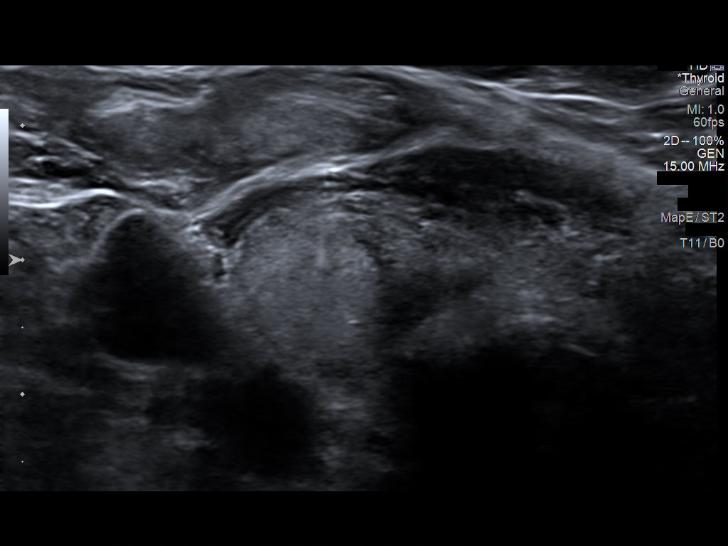
[im 16/16]
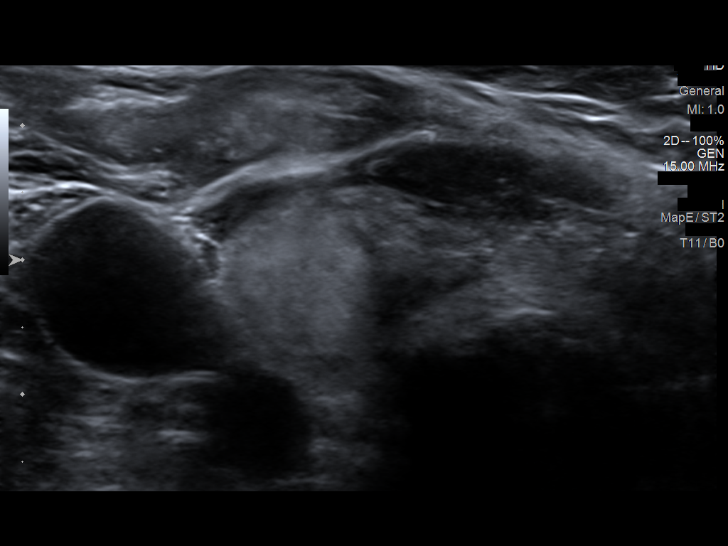

[13 of 16 positions shown; findings below may reference images not displayed]

Pre-procedural ultrasound scanning demonstrated unchanged size and
appearance of the indeterminate nodule within the right thyroid lobe

The procedure was planned. The neck was prepped in the usual sterile
fashion, and a sterile drape was applied covering the operative
field. A timeout was performed prior to the initiation of the
procedure. Local anesthesia was provided with 1% lidocaine.

Under direct ultrasound guidance, 4 FNA biopsies were performed of
the right inferior thyroid nodule with a 25 gauge needle. Multiple
ultrasound images were saved for procedural documentation purposes.
The samples were prepared and submitted to pathology.

Limited post procedural scanning was negative for hematoma or
additional complication. Dressings were placed. The patient
tolerated the above procedures procedure well without immediate
postprocedural complication.
FINDINGS: FINDINGS
Nodule reference number based on prior diagnostic ultrasound: 1

Maximum size: 2.5 cm

Location: Right;  inferior

ACR TI-RADS total points: 3

ACR TI-RADS risk category:  TR3 (3 points)

Prior biopsy:  No

Reason for biopsy: meets ACR TI-RADS criteria

Ultrasound imaging confirms appropriate placement of the needles
within the thyroid nodule.
IMPRESSION: Technically successful ultrasound guided fine needle aspiration
biopsy of the right inferior thyroid nodule.

This exam was performed by [employer] Idovia, and was supervised
and interpreted by Dr. Don Lolito.

## 2024-02-04 ENCOUNTER — Other Ambulatory Visit: Payer: Self-pay | Admitting: Internal Medicine

## 2024-02-04 DIAGNOSIS — F331 Major depressive disorder, recurrent, moderate: Secondary | ICD-10-CM

## 2024-02-08 NOTE — Telephone Encounter (Signed)
 Rx 08/25/22 #90 1RF- expired Rx, fails lab- over 1 year, seeing another provider Requested Prescriptions  Pending Prescriptions Disp Refills   buPROPion  (WELLBUTRIN  XL) 300 MG 24 hr tablet [Pharmacy Med Name: BUPROPION  XL 300MG  TABLETS] 90 tablet 1    Sig: TAKE 1 TABLET(300 MG) BY MOUTH DAILY     Psychiatry: Antidepressants - bupropion  Failed - 02/08/2024  2:41 PM      Failed - Cr in normal range and within 360 days    Creatinine, Ser  Date Value Ref Range Status  08/25/2022 0.81 0.57 - 1.00 mg/dL Final         Failed - AST in normal range and within 360 days    AST  Date Value Ref Range Status  08/25/2022 16 0 - 40 IU/L Final         Failed - ALT in normal range and within 360 days    ALT  Date Value Ref Range Status  08/25/2022 16 0 - 32 IU/L Final         Failed - Valid encounter within last 6 months    Recent Outpatient Visits   None            Passed - Completed PHQ-2 or PHQ-9 in the last 360 days      Passed - Last BP in normal range    BP Readings from Last 1 Encounters:  08/25/22 126/72

## 2024-02-24 ENCOUNTER — Ambulatory Visit

## 2024-04-26 ENCOUNTER — Other Ambulatory Visit: Payer: Self-pay | Admitting: Internal Medicine

## 2024-04-26 DIAGNOSIS — I10 Essential (primary) hypertension: Secondary | ICD-10-CM

## 2024-04-27 NOTE — Telephone Encounter (Signed)
 OFFICE VISIT NEEDED FOR ADDITIONAL REFILLS .  Requested Prescriptions  Pending Prescriptions Disp Refills   losartan  (COZAAR ) 25 MG tablet [Pharmacy Med Name: LOSARTAN  25MG  TABLETS] 30 tablet 0    Sig: TAKE 1 TABLET(25 MG) BY MOUTH DAILY     Cardiovascular:  Angiotensin Receptor Blockers Failed - 04/27/2024 11:02 AM      Failed - Cr in normal range and within 180 days    Creatinine, Ser  Date Value Ref Range Status  08/25/2022 0.81 0.57 - 1.00 mg/dL Final         Failed - K in normal range and within 180 days    Potassium  Date Value Ref Range Status  08/25/2022 4.6 3.5 - 5.2 mmol/L Final         Failed - Valid encounter within last 6 months    Recent Outpatient Visits   None            Passed - Patient is not pregnant      Passed - Last BP in normal range    BP Readings from Last 1 Encounters:  08/25/22 126/72
